# Patient Record
Sex: Female | Born: 1940 | Race: Black or African American | Hispanic: No | Smoking: Never smoker
Health system: Southern US, Community
[De-identification: ages and names within clinical notes are randomized; demographics above are authoritative.]

## PROBLEM LIST (undated history)

## (undated) DIAGNOSIS — D649 Anemia, unspecified: Secondary | ICD-10-CM

## (undated) DIAGNOSIS — M549 Dorsalgia, unspecified: Secondary | ICD-10-CM

## (undated) DIAGNOSIS — C189 Malignant neoplasm of colon, unspecified: Secondary | ICD-10-CM

## (undated) DIAGNOSIS — I10 Essential (primary) hypertension: Secondary | ICD-10-CM

## (undated) HISTORY — DX: Dorsalgia, unspecified: M54.9

## (undated) HISTORY — PX: ABDOMINAL HYSTERECTOMY: SHX81

## (undated) HISTORY — PX: CHOLECYSTECTOMY: SHX55

## (undated) HISTORY — DX: Anemia, unspecified: D64.9

## (undated) HISTORY — DX: Essential (primary) hypertension: I10

## (undated) HISTORY — DX: Malignant neoplasm of colon, unspecified: C18.9

---

## 2005-08-25 ENCOUNTER — Ambulatory Visit: Payer: Self-pay | Admitting: Internal Medicine

## 2006-08-31 ENCOUNTER — Ambulatory Visit: Payer: Self-pay | Admitting: Internal Medicine

## 2008-01-04 ENCOUNTER — Ambulatory Visit: Payer: Self-pay | Admitting: Internal Medicine

## 2009-01-07 ENCOUNTER — Ambulatory Visit: Payer: Self-pay | Admitting: Internal Medicine

## 2009-01-21 ENCOUNTER — Ambulatory Visit: Payer: Self-pay | Admitting: Internal Medicine

## 2009-03-19 ENCOUNTER — Ambulatory Visit: Payer: Self-pay | Admitting: Surgery

## 2009-06-30 ENCOUNTER — Ambulatory Visit: Payer: Self-pay | Admitting: Surgery

## 2011-03-08 ENCOUNTER — Ambulatory Visit: Payer: Self-pay | Admitting: Internal Medicine

## 2011-04-13 ENCOUNTER — Ambulatory Visit: Payer: Self-pay | Admitting: Surgery

## 2012-05-29 ENCOUNTER — Ambulatory Visit: Payer: Self-pay | Admitting: Internal Medicine

## 2012-05-30 ENCOUNTER — Ambulatory Visit: Payer: Self-pay | Admitting: Internal Medicine

## 2013-06-28 ENCOUNTER — Ambulatory Visit: Payer: Self-pay | Admitting: Internal Medicine

## 2013-07-02 ENCOUNTER — Ambulatory Visit: Payer: Self-pay | Admitting: Internal Medicine

## 2013-11-01 ENCOUNTER — Ambulatory Visit: Payer: Self-pay | Admitting: Gastroenterology

## 2013-11-02 ENCOUNTER — Emergency Department: Payer: Self-pay | Admitting: Emergency Medicine

## 2013-11-02 LAB — PATHOLOGY REPORT

## 2013-11-12 ENCOUNTER — Ambulatory Visit: Payer: Self-pay | Admitting: Surgery

## 2013-11-12 ENCOUNTER — Ambulatory Visit: Payer: Self-pay | Admitting: Anesthesiology

## 2013-11-12 LAB — POTASSIUM: Potassium: 3 mmol/L — ABNORMAL LOW (ref 3.5–5.1)

## 2013-11-16 ENCOUNTER — Inpatient Hospital Stay: Payer: Self-pay | Admitting: Surgery

## 2013-11-17 LAB — CBC WITH DIFFERENTIAL/PLATELET
Basophil #: 0 10*3/uL (ref 0.0–0.1)
Basophil %: 0.2 %
Eosinophil #: 0 10*3/uL (ref 0.0–0.7)
Eosinophil %: 0 %
HCT: 31.8 % — ABNORMAL LOW (ref 35.0–47.0)
HGB: 9.8 g/dL — ABNORMAL LOW (ref 12.0–16.0)
Lymphocyte #: 0.8 10*3/uL — ABNORMAL LOW (ref 1.0–3.6)
Lymphocyte %: 6.6 %
MCH: 23.8 pg — ABNORMAL LOW (ref 26.0–34.0)
MCHC: 30.7 g/dL — ABNORMAL LOW (ref 32.0–36.0)
MCV: 77 fL — ABNORMAL LOW (ref 80–100)
Monocyte #: 0.7 x10 3/mm (ref 0.2–0.9)
Monocyte %: 5.7 %
Neutrophil %: 87.5 %
Platelet: 230 10*3/uL (ref 150–440)
RBC: 4.1 10*6/uL (ref 3.80–5.20)

## 2013-11-17 LAB — BASIC METABOLIC PANEL
BUN: 8 mg/dL (ref 7–18)
Chloride: 104 mmol/L (ref 98–107)
Co2: 29 mmol/L (ref 21–32)
Creatinine: 1.04 mg/dL (ref 0.60–1.30)
EGFR (Non-African Amer.): 54 — ABNORMAL LOW
Glucose: 138 mg/dL — ABNORMAL HIGH (ref 65–99)
Potassium: 4.3 mmol/L (ref 3.5–5.1)
Sodium: 137 mmol/L (ref 136–145)

## 2013-11-21 LAB — PATHOLOGY REPORT

## 2013-12-04 ENCOUNTER — Ambulatory Visit: Payer: Self-pay | Admitting: Oncology

## 2013-12-25 LAB — COMPREHENSIVE METABOLIC PANEL
ALT: 48 U/L (ref 12–78)
AST: 38 U/L — AB (ref 15–37)
Albumin: 3.7 g/dL (ref 3.4–5.0)
Alkaline Phosphatase: 140 U/L — ABNORMAL HIGH
Anion Gap: 8 (ref 7–16)
BILIRUBIN TOTAL: 0.3 mg/dL (ref 0.2–1.0)
BUN: 14 mg/dL (ref 7–18)
CALCIUM: 9.3 mg/dL (ref 8.5–10.1)
CREATININE: 1.21 mg/dL (ref 0.60–1.30)
Chloride: 101 mmol/L (ref 98–107)
Co2: 30 mmol/L (ref 21–32)
EGFR (African American): 52 — ABNORMAL LOW
EGFR (Non-African Amer.): 45 — ABNORMAL LOW
GLUCOSE: 116 mg/dL — AB (ref 65–99)
OSMOLALITY: 279 (ref 275–301)
POTASSIUM: 3.1 mmol/L — AB (ref 3.5–5.1)
Sodium: 139 mmol/L (ref 136–145)
Total Protein: 8.6 g/dL — ABNORMAL HIGH (ref 6.4–8.2)

## 2013-12-25 LAB — CBC CANCER CENTER
Basophil #: 0 x10 3/mm (ref 0.0–0.1)
Basophil %: 0.5 %
EOS ABS: 0.2 x10 3/mm (ref 0.0–0.7)
Eosinophil %: 2 %
HCT: 38 % (ref 35.0–47.0)
HGB: 11.8 g/dL — AB (ref 12.0–16.0)
Lymphocyte #: 1.3 x10 3/mm (ref 1.0–3.6)
Lymphocyte %: 12.6 %
MCH: 24.6 pg — AB (ref 26.0–34.0)
MCHC: 31 g/dL — AB (ref 32.0–36.0)
MCV: 79 fL — ABNORMAL LOW (ref 80–100)
Monocyte #: 0.5 x10 3/mm (ref 0.2–0.9)
Monocyte %: 4.7 %
NEUTROS ABS: 8.2 x10 3/mm — AB (ref 1.4–6.5)
NEUTROS PCT: 80.2 %
Platelet: 220 x10 3/mm (ref 150–440)
RBC: 4.79 10*6/uL (ref 3.80–5.20)
RDW: 15.3 % — ABNORMAL HIGH (ref 11.5–14.5)
WBC: 10.2 x10 3/mm (ref 3.6–11.0)

## 2013-12-26 LAB — CEA: CEA: 392.1 ng/mL — AB (ref 0.0–4.7)

## 2013-12-30 ENCOUNTER — Ambulatory Visit: Payer: Self-pay | Admitting: Oncology

## 2014-01-01 ENCOUNTER — Ambulatory Visit: Payer: Self-pay | Admitting: Surgery

## 2014-01-03 ENCOUNTER — Ambulatory Visit: Payer: Self-pay | Admitting: Oncology

## 2014-01-03 LAB — CBC CANCER CENTER
BASOS ABS: 0.1 x10 3/mm (ref 0.0–0.1)
BASOS PCT: 0.6 %
Eosinophil #: 0.3 x10 3/mm (ref 0.0–0.7)
Eosinophil %: 2.6 %
HCT: 35.8 % (ref 35.0–47.0)
HGB: 11 g/dL — AB (ref 12.0–16.0)
Lymphocyte #: 1.6 x10 3/mm (ref 1.0–3.6)
Lymphocyte %: 13.8 %
MCH: 24 pg — ABNORMAL LOW (ref 26.0–34.0)
MCHC: 30.7 g/dL — AB (ref 32.0–36.0)
MCV: 78 fL — ABNORMAL LOW (ref 80–100)
MONO ABS: 0.7 x10 3/mm (ref 0.2–0.9)
MONOS PCT: 5.8 %
NEUTROS PCT: 77.2 %
Neutrophil #: 8.9 x10 3/mm — ABNORMAL HIGH (ref 1.4–6.5)
PLATELETS: 221 x10 3/mm (ref 150–440)
RBC: 4.58 10*6/uL (ref 3.80–5.20)
RDW: 14.9 % — ABNORMAL HIGH (ref 11.5–14.5)
WBC: 11.5 x10 3/mm — AB (ref 3.6–11.0)

## 2014-01-03 LAB — COMPREHENSIVE METABOLIC PANEL
ALBUMIN: 3.4 g/dL (ref 3.4–5.0)
ALK PHOS: 122 U/L — AB
Anion Gap: 10 (ref 7–16)
BUN: 13 mg/dL (ref 7–18)
Bilirubin,Total: 0.5 mg/dL (ref 0.2–1.0)
CHLORIDE: 102 mmol/L (ref 98–107)
Calcium, Total: 8.6 mg/dL (ref 8.5–10.1)
Co2: 29 mmol/L (ref 21–32)
Creatinine: 1.23 mg/dL (ref 0.60–1.30)
EGFR (African American): 51 — ABNORMAL LOW
GFR CALC NON AF AMER: 44 — AB
GLUCOSE: 126 mg/dL — AB (ref 65–99)
Osmolality: 283 (ref 275–301)
POTASSIUM: 3.7 mmol/L (ref 3.5–5.1)
SGOT(AST): 48 U/L — ABNORMAL HIGH (ref 15–37)
SGPT (ALT): 43 U/L (ref 12–78)
Sodium: 141 mmol/L (ref 136–145)
Total Protein: 7.8 g/dL (ref 6.4–8.2)

## 2014-01-10 LAB — COMPREHENSIVE METABOLIC PANEL
ALBUMIN: 3.4 g/dL (ref 3.4–5.0)
ALK PHOS: 113 U/L
ANION GAP: 11 (ref 7–16)
BILIRUBIN TOTAL: 0.3 mg/dL (ref 0.2–1.0)
BUN: 13 mg/dL (ref 7–18)
CALCIUM: 8.9 mg/dL (ref 8.5–10.1)
Chloride: 98 mmol/L (ref 98–107)
Co2: 29 mmol/L (ref 21–32)
Creatinine: 1.15 mg/dL (ref 0.60–1.30)
EGFR (African American): 55 — ABNORMAL LOW
EGFR (Non-African Amer.): 48 — ABNORMAL LOW
Glucose: 154 mg/dL — ABNORMAL HIGH (ref 65–99)
Osmolality: 279 (ref 275–301)
Potassium: 3.4 mmol/L — ABNORMAL LOW (ref 3.5–5.1)
SGOT(AST): 24 U/L (ref 15–37)
SGPT (ALT): 30 U/L (ref 12–78)
Sodium: 138 mmol/L (ref 136–145)
Total Protein: 7.7 g/dL (ref 6.4–8.2)

## 2014-01-10 LAB — CBC CANCER CENTER
BASOS ABS: 0 x10 3/mm (ref 0.0–0.1)
BASOS PCT: 0.3 %
Eosinophil #: 0.3 x10 3/mm (ref 0.0–0.7)
Eosinophil %: 4.5 %
HCT: 36.2 % (ref 35.0–47.0)
HGB: 11.1 g/dL — ABNORMAL LOW (ref 12.0–16.0)
LYMPHS PCT: 16.1 %
Lymphocyte #: 1.1 x10 3/mm (ref 1.0–3.6)
MCH: 24.2 pg — ABNORMAL LOW (ref 26.0–34.0)
MCHC: 30.6 g/dL — ABNORMAL LOW (ref 32.0–36.0)
MCV: 79 fL — ABNORMAL LOW (ref 80–100)
MONOS PCT: 2.9 %
Monocyte #: 0.2 x10 3/mm (ref 0.2–0.9)
NEUTROS ABS: 5.3 x10 3/mm (ref 1.4–6.5)
NEUTROS PCT: 76.2 %
PLATELETS: 211 x10 3/mm (ref 150–440)
RBC: 4.59 10*6/uL (ref 3.80–5.20)
RDW: 14.8 % — ABNORMAL HIGH (ref 11.5–14.5)
WBC: 7 x10 3/mm (ref 3.6–11.0)

## 2014-01-17 LAB — COMPREHENSIVE METABOLIC PANEL
ALK PHOS: 124 U/L — AB
Albumin: 3.3 g/dL — ABNORMAL LOW (ref 3.4–5.0)
Anion Gap: 13 (ref 7–16)
BILIRUBIN TOTAL: 0.3 mg/dL (ref 0.2–1.0)
BUN: 18 mg/dL (ref 7–18)
CHLORIDE: 103 mmol/L (ref 98–107)
Calcium, Total: 10.2 mg/dL — ABNORMAL HIGH (ref 8.5–10.1)
Co2: 26 mmol/L (ref 21–32)
Creatinine: 1.18 mg/dL (ref 0.60–1.30)
GFR CALC AF AMER: 54 — AB
GFR CALC NON AF AMER: 46 — AB
GLUCOSE: 100 mg/dL — AB (ref 65–99)
Osmolality: 285 (ref 275–301)
POTASSIUM: 3.2 mmol/L — AB (ref 3.5–5.1)
SGOT(AST): 27 U/L (ref 15–37)
SGPT (ALT): 35 U/L (ref 12–78)
Sodium: 142 mmol/L (ref 136–145)
Total Protein: 7.2 g/dL (ref 6.4–8.2)

## 2014-01-17 LAB — CBC CANCER CENTER
BASOS ABS: 0 x10 3/mm (ref 0.0–0.1)
Basophil %: 0.7 %
EOS PCT: 1.5 %
Eosinophil #: 0.1 x10 3/mm (ref 0.0–0.7)
HCT: 34 % — AB (ref 35.0–47.0)
HGB: 10.5 g/dL — ABNORMAL LOW (ref 12.0–16.0)
LYMPHS PCT: 28.4 %
Lymphocyte #: 1 x10 3/mm (ref 1.0–3.6)
MCH: 24.1 pg — AB (ref 26.0–34.0)
MCHC: 30.8 g/dL — AB (ref 32.0–36.0)
MCV: 78 fL — AB (ref 80–100)
MONOS PCT: 8.6 %
Monocyte #: 0.3 x10 3/mm (ref 0.2–0.9)
Neutrophil #: 2.2 x10 3/mm (ref 1.4–6.5)
Neutrophil %: 60.8 %
Platelet: 201 x10 3/mm (ref 150–440)
RBC: 4.35 10*6/uL (ref 3.80–5.20)
RDW: 14.8 % — AB (ref 11.5–14.5)
WBC: 3.5 x10 3/mm — ABNORMAL LOW (ref 3.6–11.0)

## 2014-01-27 ENCOUNTER — Ambulatory Visit: Payer: Self-pay | Admitting: Oncology

## 2014-01-31 LAB — CBC CANCER CENTER
BASOS ABS: 0 x10 3/mm (ref 0.0–0.1)
BASOS PCT: 1 %
EOS PCT: 3.7 %
Eosinophil #: 0.1 x10 3/mm (ref 0.0–0.7)
HCT: 33.9 % — ABNORMAL LOW (ref 35.0–47.0)
HGB: 10.3 g/dL — AB (ref 12.0–16.0)
Lymphocyte #: 1.3 x10 3/mm (ref 1.0–3.6)
Lymphocyte %: 33.7 %
MCH: 23.8 pg — AB (ref 26.0–34.0)
MCHC: 30.4 g/dL — ABNORMAL LOW (ref 32.0–36.0)
MCV: 78 fL — AB (ref 80–100)
MONO ABS: 0.4 x10 3/mm (ref 0.2–0.9)
MONOS PCT: 9.9 %
NEUTROS PCT: 51.7 %
Neutrophil #: 1.9 x10 3/mm (ref 1.4–6.5)
Platelet: 169 x10 3/mm (ref 150–440)
RBC: 4.33 10*6/uL (ref 3.80–5.20)
RDW: 14.7 % — ABNORMAL HIGH (ref 11.5–14.5)
WBC: 3.7 x10 3/mm (ref 3.6–11.0)

## 2014-01-31 LAB — COMPREHENSIVE METABOLIC PANEL
ALBUMIN: 3.1 g/dL — AB (ref 3.4–5.0)
ALT: 24 U/L (ref 12–78)
ANION GAP: 11 (ref 7–16)
AST: 21 U/L (ref 15–37)
Alkaline Phosphatase: 140 U/L — ABNORMAL HIGH
BUN: 10 mg/dL (ref 7–18)
Bilirubin,Total: 0.3 mg/dL (ref 0.2–1.0)
Calcium, Total: 8.5 mg/dL (ref 8.5–10.1)
Chloride: 104 mmol/L (ref 98–107)
Co2: 24 mmol/L (ref 21–32)
Creatinine: 1.27 mg/dL (ref 0.60–1.30)
EGFR (African American): 49 — ABNORMAL LOW
EGFR (Non-African Amer.): 42 — ABNORMAL LOW
GLUCOSE: 155 mg/dL — AB (ref 65–99)
OSMOLALITY: 280 (ref 275–301)
POTASSIUM: 3.3 mmol/L — AB (ref 3.5–5.1)
Sodium: 139 mmol/L (ref 136–145)
Total Protein: 6.9 g/dL (ref 6.4–8.2)

## 2014-02-14 LAB — COMPREHENSIVE METABOLIC PANEL
ALBUMIN: 3.4 g/dL (ref 3.4–5.0)
ALT: 24 U/L (ref 12–78)
ANION GAP: 13 (ref 7–16)
Alkaline Phosphatase: 159 U/L — ABNORMAL HIGH
BILIRUBIN TOTAL: 0.4 mg/dL (ref 0.2–1.0)
BUN: 15 mg/dL (ref 7–18)
CALCIUM: 9.7 mg/dL (ref 8.5–10.1)
CHLORIDE: 104 mmol/L (ref 98–107)
CREATININE: 1.18 mg/dL (ref 0.60–1.30)
Co2: 24 mmol/L (ref 21–32)
EGFR (Non-African Amer.): 46 — ABNORMAL LOW
GFR CALC AF AMER: 54 — AB
Glucose: 107 mg/dL — ABNORMAL HIGH (ref 65–99)
Osmolality: 283 (ref 275–301)
Potassium: 3 mmol/L — ABNORMAL LOW (ref 3.5–5.1)
SGOT(AST): 27 U/L (ref 15–37)
SODIUM: 141 mmol/L (ref 136–145)
TOTAL PROTEIN: 7.5 g/dL (ref 6.4–8.2)

## 2014-02-14 LAB — CBC CANCER CENTER
Basophil #: 0 x10 3/mm (ref 0.0–0.1)
Basophil %: 1.1 %
Eosinophil #: 0.1 x10 3/mm (ref 0.0–0.7)
Eosinophil %: 3.6 %
HCT: 35.7 % (ref 35.0–47.0)
HGB: 11 g/dL — ABNORMAL LOW (ref 12.0–16.0)
LYMPHS ABS: 1.4 x10 3/mm (ref 1.0–3.6)
Lymphocyte %: 37.1 %
MCH: 23.9 pg — ABNORMAL LOW (ref 26.0–34.0)
MCHC: 30.9 g/dL — ABNORMAL LOW (ref 32.0–36.0)
MCV: 77 fL — AB (ref 80–100)
Monocyte #: 0.3 x10 3/mm (ref 0.2–0.9)
Monocyte %: 9 %
NEUTROS ABS: 1.9 x10 3/mm (ref 1.4–6.5)
Neutrophil %: 49.2 %
Platelet: 154 x10 3/mm (ref 150–440)
RBC: 4.61 10*6/uL (ref 3.80–5.20)
RDW: 15.8 % — ABNORMAL HIGH (ref 11.5–14.5)
WBC: 3.8 x10 3/mm (ref 3.6–11.0)

## 2014-02-16 LAB — CEA: CEA: 166.6 ng/mL — ABNORMAL HIGH (ref 0.0–4.7)

## 2014-02-27 ENCOUNTER — Ambulatory Visit: Payer: Self-pay | Admitting: Oncology

## 2014-02-28 LAB — CBC CANCER CENTER
Basophil #: 0 x10 3/mm (ref 0.0–0.1)
Basophil %: 1 %
Eosinophil #: 0.1 x10 3/mm (ref 0.0–0.7)
Eosinophil %: 2.5 %
HCT: 33 % — ABNORMAL LOW (ref 35.0–47.0)
HGB: 10.3 g/dL — ABNORMAL LOW (ref 12.0–16.0)
LYMPHS ABS: 1 x10 3/mm (ref 1.0–3.6)
Lymphocyte %: 41.3 %
MCH: 24.4 pg — ABNORMAL LOW (ref 26.0–34.0)
MCHC: 31.1 g/dL — AB (ref 32.0–36.0)
MCV: 78 fL — ABNORMAL LOW (ref 80–100)
MONO ABS: 0.3 x10 3/mm (ref 0.2–0.9)
Monocyte %: 11.7 %
NEUTROS ABS: 1 x10 3/mm — AB (ref 1.4–6.5)
Neutrophil %: 43.5 %
Platelet: 101 x10 3/mm — ABNORMAL LOW (ref 150–440)
RBC: 4.22 10*6/uL (ref 3.80–5.20)
RDW: 16.7 % — ABNORMAL HIGH (ref 11.5–14.5)
WBC: 2.4 x10 3/mm — ABNORMAL LOW (ref 3.6–11.0)

## 2014-02-28 LAB — COMPREHENSIVE METABOLIC PANEL
ALT: 32 U/L (ref 12–78)
Albumin: 3.2 g/dL — ABNORMAL LOW (ref 3.4–5.0)
Alkaline Phosphatase: 139 U/L — ABNORMAL HIGH
Anion Gap: 12 (ref 7–16)
BILIRUBIN TOTAL: 0.5 mg/dL (ref 0.2–1.0)
BUN: 12 mg/dL (ref 7–18)
CO2: 26 mmol/L (ref 21–32)
CREATININE: 1.09 mg/dL (ref 0.60–1.30)
Calcium, Total: 9.3 mg/dL (ref 8.5–10.1)
Chloride: 106 mmol/L (ref 98–107)
EGFR (African American): 59 — ABNORMAL LOW
GFR CALC NON AF AMER: 51 — AB
GLUCOSE: 100 mg/dL — AB (ref 65–99)
OSMOLALITY: 287 (ref 275–301)
Potassium: 3.2 mmol/L — ABNORMAL LOW (ref 3.5–5.1)
SGOT(AST): 34 U/L (ref 15–37)
SODIUM: 144 mmol/L (ref 136–145)
TOTAL PROTEIN: 6.8 g/dL (ref 6.4–8.2)

## 2014-03-07 LAB — CBC CANCER CENTER
BASOS ABS: 0 x10 3/mm (ref 0.0–0.1)
BASOS PCT: 0.6 %
EOS PCT: 1.7 %
Eosinophil #: 0.1 x10 3/mm (ref 0.0–0.7)
HCT: 35.3 % (ref 35.0–47.0)
HGB: 10.7 g/dL — ABNORMAL LOW (ref 12.0–16.0)
LYMPHS PCT: 31.7 %
Lymphocyte #: 1.4 x10 3/mm (ref 1.0–3.6)
MCH: 24.1 pg — ABNORMAL LOW (ref 26.0–34.0)
MCHC: 30.4 g/dL — AB (ref 32.0–36.0)
MCV: 79 fL — ABNORMAL LOW (ref 80–100)
Monocyte #: 0.5 x10 3/mm (ref 0.2–0.9)
Monocyte %: 11.5 %
Neutrophil #: 2.4 x10 3/mm (ref 1.4–6.5)
Neutrophil %: 54.5 %
Platelet: 142 x10 3/mm — ABNORMAL LOW (ref 150–440)
RBC: 4.44 10*6/uL (ref 3.80–5.20)
RDW: 17.6 % — ABNORMAL HIGH (ref 11.5–14.5)
WBC: 4.5 x10 3/mm (ref 3.6–11.0)

## 2014-03-07 LAB — COMPREHENSIVE METABOLIC PANEL
ALBUMIN: 3.3 g/dL — AB (ref 3.4–5.0)
ALK PHOS: 163 U/L — AB
ANION GAP: 10 (ref 7–16)
BUN: 8 mg/dL (ref 7–18)
Bilirubin,Total: 0.3 mg/dL (ref 0.2–1.0)
CALCIUM: 9.3 mg/dL (ref 8.5–10.1)
CREATININE: 1.14 mg/dL (ref 0.60–1.30)
Chloride: 105 mmol/L (ref 98–107)
Co2: 26 mmol/L (ref 21–32)
EGFR (African American): 56 — ABNORMAL LOW
EGFR (Non-African Amer.): 48 — ABNORMAL LOW
GLUCOSE: 94 mg/dL (ref 65–99)
Osmolality: 279 (ref 275–301)
Potassium: 3.5 mmol/L (ref 3.5–5.1)
SGOT(AST): 23 U/L (ref 15–37)
SGPT (ALT): 22 U/L (ref 12–78)
Sodium: 141 mmol/L (ref 136–145)
Total Protein: 7.1 g/dL (ref 6.4–8.2)

## 2014-03-07 LAB — MAGNESIUM: MAGNESIUM: 1.9 mg/dL

## 2014-03-21 LAB — CBC CANCER CENTER
Basophil #: 0 x10 3/mm (ref 0.0–0.1)
Basophil %: 0.6 %
EOS ABS: 0.1 x10 3/mm (ref 0.0–0.7)
EOS PCT: 1.5 %
HCT: 34.9 % — ABNORMAL LOW (ref 35.0–47.0)
HGB: 10.7 g/dL — AB (ref 12.0–16.0)
LYMPHS PCT: 20.2 %
Lymphocyte #: 1.1 x10 3/mm (ref 1.0–3.6)
MCH: 24.2 pg — AB (ref 26.0–34.0)
MCHC: 30.7 g/dL — ABNORMAL LOW (ref 32.0–36.0)
MCV: 79 fL — ABNORMAL LOW (ref 80–100)
MONOS PCT: 6.9 %
Monocyte #: 0.4 x10 3/mm (ref 0.2–0.9)
NEUTROS ABS: 3.9 x10 3/mm (ref 1.4–6.5)
Neutrophil %: 70.8 %
Platelet: 136 x10 3/mm — ABNORMAL LOW (ref 150–440)
RBC: 4.43 10*6/uL (ref 3.80–5.20)
RDW: 17.1 % — AB (ref 11.5–14.5)
WBC: 5.5 x10 3/mm (ref 3.6–11.0)

## 2014-03-21 LAB — COMPREHENSIVE METABOLIC PANEL
ALK PHOS: 165 U/L — AB
ALT: 22 U/L (ref 12–78)
ANION GAP: 10 (ref 7–16)
AST: 24 U/L (ref 15–37)
Albumin: 3.3 g/dL — ABNORMAL LOW (ref 3.4–5.0)
BUN: 9 mg/dL (ref 7–18)
Bilirubin,Total: 0.3 mg/dL (ref 0.2–1.0)
CO2: 27 mmol/L (ref 21–32)
CREATININE: 1.13 mg/dL (ref 0.60–1.30)
Calcium, Total: 9.7 mg/dL (ref 8.5–10.1)
Chloride: 106 mmol/L (ref 98–107)
EGFR (African American): 57 — ABNORMAL LOW
GFR CALC NON AF AMER: 49 — AB
GLUCOSE: 99 mg/dL (ref 65–99)
Osmolality: 284 (ref 275–301)
POTASSIUM: 3.7 mmol/L (ref 3.5–5.1)
Sodium: 143 mmol/L (ref 136–145)
TOTAL PROTEIN: 7.1 g/dL (ref 6.4–8.2)

## 2014-03-29 ENCOUNTER — Ambulatory Visit: Payer: Self-pay | Admitting: Oncology

## 2014-04-04 LAB — COMPREHENSIVE METABOLIC PANEL
ALBUMIN: 3.2 g/dL — AB (ref 3.4–5.0)
ANION GAP: 8 (ref 7–16)
Alkaline Phosphatase: 161 U/L — ABNORMAL HIGH
BUN: 10 mg/dL (ref 7–18)
Bilirubin,Total: 0.2 mg/dL (ref 0.2–1.0)
Calcium, Total: 9 mg/dL (ref 8.5–10.1)
Chloride: 106 mmol/L (ref 98–107)
Co2: 28 mmol/L (ref 21–32)
Creatinine: 1.04 mg/dL (ref 0.60–1.30)
EGFR (African American): >60
GFR CALC NON AF AMER: 54 — AB
Glucose: 124 mg/dL — ABNORMAL HIGH (ref 65–99)
OSMOLALITY: 284 (ref 275–301)
POTASSIUM: 3.5 mmol/L (ref 3.5–5.1)
SGOT(AST): 27 U/L (ref 15–37)
SGPT (ALT): 26 U/L (ref 12–78)
Sodium: 142 mmol/L (ref 136–145)
Total Protein: 6.9 g/dL (ref 6.4–8.2)

## 2014-04-04 LAB — CBC CANCER CENTER
Basophil #: 0 x10 3/mm (ref 0.0–0.1)
Basophil %: 0.6 %
Eosinophil #: 0 x10 3/mm (ref 0.0–0.7)
Eosinophil %: 0.9 %
HCT: 33.7 % — ABNORMAL LOW (ref 35.0–47.0)
HGB: 10.6 g/dL — ABNORMAL LOW (ref 12.0–16.0)
LYMPHS PCT: 21.7 %
Lymphocyte #: 1.1 x10 3/mm (ref 1.0–3.6)
MCH: 24.8 pg — ABNORMAL LOW (ref 26.0–34.0)
MCHC: 31.6 g/dL — ABNORMAL LOW (ref 32.0–36.0)
MCV: 78 fL — ABNORMAL LOW (ref 80–100)
Monocyte #: 0.5 x10 3/mm (ref 0.2–0.9)
Monocyte %: 9 %
Neutrophil #: 3.4 x10 3/mm (ref 1.4–6.5)
Neutrophil %: 67.8 %
PLATELETS: 133 x10 3/mm — AB (ref 150–440)
RBC: 4.3 10*6/uL (ref 3.80–5.20)
RDW: 17.3 % — ABNORMAL HIGH (ref 11.5–14.5)
WBC: 5.1 x10 3/mm (ref 3.6–11.0)

## 2014-04-18 LAB — COMPREHENSIVE METABOLIC PANEL
ALBUMIN: 3.4 g/dL (ref 3.4–5.0)
ALT: 26 U/L (ref 12–78)
AST: 26 U/L (ref 15–37)
Alkaline Phosphatase: 166 U/L — ABNORMAL HIGH
Anion Gap: 10 (ref 7–16)
BUN: 7 mg/dL (ref 7–18)
Bilirubin,Total: 0.3 mg/dL (ref 0.2–1.0)
CREATININE: 1.02 mg/dL (ref 0.60–1.30)
Calcium, Total: 8.9 mg/dL (ref 8.5–10.1)
Chloride: 107 mmol/L (ref 98–107)
Co2: 27 mmol/L (ref 21–32)
EGFR (Non-African Amer.): 55 — ABNORMAL LOW
Glucose: 99 mg/dL (ref 65–99)
Osmolality: 285 (ref 275–301)
POTASSIUM: 3.5 mmol/L (ref 3.5–5.1)
SODIUM: 144 mmol/L (ref 136–145)
Total Protein: 7 g/dL (ref 6.4–8.2)

## 2014-04-18 LAB — CBC CANCER CENTER
Basophil #: 0.1 x10 3/mm (ref 0.0–0.1)
Basophil %: 1.5 %
EOS ABS: 0.1 x10 3/mm (ref 0.0–0.7)
Eosinophil %: 1.4 %
HCT: 33.5 % — AB (ref 35.0–47.0)
HGB: 10.5 g/dL — AB (ref 12.0–16.0)
Lymphocyte #: 1.2 x10 3/mm (ref 1.0–3.6)
Lymphocyte %: 26.7 %
MCH: 24.3 pg — AB (ref 26.0–34.0)
MCHC: 31.4 g/dL — AB (ref 32.0–36.0)
MCV: 77 fL — ABNORMAL LOW (ref 80–100)
Monocyte #: 0.4 x10 3/mm (ref 0.2–0.9)
Monocyte %: 8.6 %
Neutrophil #: 2.7 x10 3/mm (ref 1.4–6.5)
Neutrophil %: 61.8 %
Platelet: 115 x10 3/mm — ABNORMAL LOW (ref 150–440)
RBC: 4.34 10*6/uL (ref 3.80–5.20)
RDW: 17.3 % — ABNORMAL HIGH (ref 11.5–14.5)
WBC: 4.4 x10 3/mm (ref 3.6–11.0)

## 2014-04-29 ENCOUNTER — Ambulatory Visit: Payer: Self-pay | Admitting: Oncology

## 2014-05-02 LAB — COMPREHENSIVE METABOLIC PANEL
ALK PHOS: 167 U/L — AB
ANION GAP: 11 (ref 7–16)
Albumin: 3.2 g/dL — ABNORMAL LOW (ref 3.4–5.0)
BILIRUBIN TOTAL: 0.2 mg/dL (ref 0.2–1.0)
BUN: 11 mg/dL (ref 7–18)
Calcium, Total: 9.3 mg/dL (ref 8.5–10.1)
Chloride: 107 mmol/L (ref 98–107)
Co2: 25 mmol/L (ref 21–32)
Creatinine: 0.99 mg/dL (ref 0.60–1.30)
GFR CALC NON AF AMER: 57 — AB
Glucose: 119 mg/dL — ABNORMAL HIGH (ref 65–99)
Osmolality: 286 (ref 275–301)
Potassium: 3.4 mmol/L — ABNORMAL LOW (ref 3.5–5.1)
SGOT(AST): 34 U/L (ref 15–37)
SGPT (ALT): 31 U/L (ref 12–78)
SODIUM: 143 mmol/L (ref 136–145)
Total Protein: 6.9 g/dL (ref 6.4–8.2)

## 2014-05-02 LAB — CBC CANCER CENTER
Basophil #: 0 x10 3/mm (ref 0.0–0.1)
Basophil %: 0.4 %
Eosinophil #: 0.1 x10 3/mm (ref 0.0–0.7)
Eosinophil %: 1.6 %
HCT: 34.1 % — AB (ref 35.0–47.0)
HGB: 10.4 g/dL — AB (ref 12.0–16.0)
LYMPHS PCT: 24.3 %
Lymphocyte #: 1 x10 3/mm (ref 1.0–3.6)
MCH: 24.2 pg — AB (ref 26.0–34.0)
MCHC: 30.5 g/dL — AB (ref 32.0–36.0)
MCV: 79 fL — ABNORMAL LOW (ref 80–100)
MONOS PCT: 6.9 %
Monocyte #: 0.3 x10 3/mm (ref 0.2–0.9)
Neutrophil #: 2.6 x10 3/mm (ref 1.4–6.5)
Neutrophil %: 66.8 %
Platelet: 92 x10 3/mm — ABNORMAL LOW (ref 150–440)
RBC: 4.31 10*6/uL (ref 3.80–5.20)
RDW: 17.8 % — AB (ref 11.5–14.5)
WBC: 3.9 x10 3/mm (ref 3.6–11.0)

## 2014-05-03 LAB — CEA: CEA: 13 ng/mL — ABNORMAL HIGH (ref 0.0–4.7)

## 2014-05-09 LAB — CBC CANCER CENTER
BASOS ABS: 0 x10 3/mm (ref 0.0–0.1)
BASOS PCT: 0.7 %
Eosinophil #: 0.1 x10 3/mm (ref 0.0–0.7)
Eosinophil %: 2.4 %
HCT: 34.7 % — ABNORMAL LOW (ref 35.0–47.0)
HGB: 10.7 g/dL — AB (ref 12.0–16.0)
LYMPHS ABS: 1.1 x10 3/mm (ref 1.0–3.6)
Lymphocyte %: 37.2 %
MCH: 24.7 pg — ABNORMAL LOW (ref 26.0–34.0)
MCHC: 31 g/dL — AB (ref 32.0–36.0)
MCV: 80 fL (ref 80–100)
Monocyte #: 0.5 x10 3/mm (ref 0.2–0.9)
Monocyte %: 14.7 %
NEUTROS PCT: 45 %
Neutrophil #: 1.4 x10 3/mm (ref 1.4–6.5)
PLATELETS: 108 x10 3/mm — AB (ref 150–440)
RBC: 4.34 10*6/uL (ref 3.80–5.20)
RDW: 17.4 % — ABNORMAL HIGH (ref 11.5–14.5)
WBC: 3.1 x10 3/mm — ABNORMAL LOW (ref 3.6–11.0)

## 2014-05-09 LAB — COMPREHENSIVE METABOLIC PANEL
ALK PHOS: 181 U/L — AB
ALT: 24 U/L (ref 12–78)
Albumin: 3.2 g/dL — ABNORMAL LOW (ref 3.4–5.0)
Anion Gap: 12 (ref 7–16)
BUN: 11 mg/dL (ref 7–18)
Bilirubin,Total: 0.3 mg/dL (ref 0.2–1.0)
CO2: 25 mmol/L (ref 21–32)
Calcium, Total: 9.4 mg/dL (ref 8.5–10.1)
Chloride: 106 mmol/L (ref 98–107)
Creatinine: 1.1 mg/dL (ref 0.60–1.30)
GFR CALC AF AMER: 58 — AB
GFR CALC NON AF AMER: 50 — AB
GLUCOSE: 101 mg/dL — AB (ref 65–99)
OSMOLALITY: 285 (ref 275–301)
Potassium: 3.6 mmol/L (ref 3.5–5.1)
SGOT(AST): 25 U/L (ref 15–37)
SODIUM: 143 mmol/L (ref 136–145)
Total Protein: 7.2 g/dL (ref 6.4–8.2)

## 2014-05-10 LAB — CEA: CEA: 10.7 ng/mL — ABNORMAL HIGH (ref 0.0–4.7)

## 2014-05-29 ENCOUNTER — Ambulatory Visit: Payer: Self-pay | Admitting: Oncology

## 2014-06-06 LAB — CBC CANCER CENTER
BASOS PCT: 0.6 %
Basophil #: 0 x10 3/mm (ref 0.0–0.1)
Eosinophil #: 0.1 x10 3/mm (ref 0.0–0.7)
Eosinophil %: 1.1 %
HCT: 35 % (ref 35.0–47.0)
HGB: 10.9 g/dL — ABNORMAL LOW (ref 12.0–16.0)
LYMPHS ABS: 1.4 x10 3/mm (ref 1.0–3.6)
Lymphocyte %: 19.5 %
MCH: 25.3 pg — AB (ref 26.0–34.0)
MCHC: 31.2 g/dL — ABNORMAL LOW (ref 32.0–36.0)
MCV: 81 fL (ref 80–100)
MONO ABS: 0.5 x10 3/mm (ref 0.2–0.9)
Monocyte %: 6.8 %
NEUTROS PCT: 72 %
Neutrophil #: 5.2 x10 3/mm (ref 1.4–6.5)
Platelet: 128 x10 3/mm — ABNORMAL LOW (ref 150–440)
RBC: 4.31 10*6/uL (ref 3.80–5.20)
RDW: 16.8 % — ABNORMAL HIGH (ref 11.5–14.5)
WBC: 7.3 x10 3/mm (ref 3.6–11.0)

## 2014-06-06 LAB — COMPREHENSIVE METABOLIC PANEL
ALT: 26 U/L (ref 12–78)
ANION GAP: 13 (ref 7–16)
AST: 33 U/L (ref 15–37)
Albumin: 3.3 g/dL — ABNORMAL LOW (ref 3.4–5.0)
Alkaline Phosphatase: 172 U/L — ABNORMAL HIGH
BUN: 10 mg/dL (ref 7–18)
Bilirubin,Total: 0.3 mg/dL (ref 0.2–1.0)
CHLORIDE: 104 mmol/L (ref 98–107)
Calcium, Total: 9.3 mg/dL (ref 8.5–10.1)
Co2: 27 mmol/L (ref 21–32)
Creatinine: 1.09 mg/dL (ref 0.60–1.30)
EGFR (African American): 59 — ABNORMAL LOW
GFR CALC NON AF AMER: 51 — AB
GLUCOSE: 97 mg/dL (ref 65–99)
OSMOLALITY: 286 (ref 275–301)
POTASSIUM: 3.7 mmol/L (ref 3.5–5.1)
Sodium: 144 mmol/L (ref 136–145)
TOTAL PROTEIN: 7.4 g/dL (ref 6.4–8.2)

## 2014-06-07 LAB — CEA: CEA: 6.9 ng/mL — ABNORMAL HIGH (ref 0.0–4.7)

## 2014-06-13 LAB — BASIC METABOLIC PANEL
Anion Gap: 6 — ABNORMAL LOW (ref 7–16)
BUN: 9 mg/dL (ref 7–18)
CREATININE: 0.89 mg/dL (ref 0.60–1.30)
Calcium, Total: 9 mg/dL (ref 8.5–10.1)
Chloride: 107 mmol/L (ref 98–107)
Co2: 27 mmol/L (ref 21–32)
EGFR (African American): >60
EGFR (Non-African Amer.): >60
Glucose: 82 mg/dL (ref 65–99)
Osmolality: 277 (ref 275–301)
Potassium: 3.7 mmol/L (ref 3.5–5.1)
Sodium: 140 mmol/L (ref 136–145)

## 2014-06-13 LAB — CBC CANCER CENTER
BASOS ABS: 0 x10 3/mm (ref 0.0–0.1)
Basophil %: 0.5 %
Eosinophil #: 0.2 x10 3/mm (ref 0.0–0.7)
Eosinophil %: 2.3 %
HCT: 34.3 % — ABNORMAL LOW (ref 35.0–47.0)
HGB: 10.5 g/dL — AB (ref 12.0–16.0)
Lymphocyte #: 1.6 x10 3/mm (ref 1.0–3.6)
Lymphocyte %: 23.9 %
MCH: 24.7 pg — ABNORMAL LOW (ref 26.0–34.0)
MCHC: 30.6 g/dL — AB (ref 32.0–36.0)
MCV: 81 fL (ref 80–100)
Monocyte #: 0.4 x10 3/mm (ref 0.2–0.9)
Monocyte %: 6 %
NEUTROS ABS: 4.6 x10 3/mm (ref 1.4–6.5)
Neutrophil %: 67.3 %
PLATELETS: 128 x10 3/mm — AB (ref 150–440)
RBC: 4.25 10*6/uL (ref 3.80–5.20)
RDW: 16.4 % — ABNORMAL HIGH (ref 11.5–14.5)
WBC: 6.8 x10 3/mm (ref 3.6–11.0)

## 2014-06-29 ENCOUNTER — Ambulatory Visit: Payer: Self-pay | Admitting: Oncology

## 2014-07-30 ENCOUNTER — Ambulatory Visit: Payer: Self-pay | Admitting: Oncology

## 2014-09-12 ENCOUNTER — Ambulatory Visit: Payer: Self-pay | Admitting: Family Medicine

## 2014-09-16 ENCOUNTER — Ambulatory Visit: Payer: Self-pay | Admitting: Oncology

## 2014-09-16 LAB — COMPREHENSIVE METABOLIC PANEL
ALBUMIN: 3.6 g/dL (ref 3.4–5.0)
ALK PHOS: 144 U/L — AB
AST: 25 U/L (ref 15–37)
Anion Gap: 8 (ref 7–16)
BUN: 17 mg/dL (ref 7–18)
Bilirubin,Total: 0.4 mg/dL (ref 0.2–1.0)
Calcium, Total: 9.9 mg/dL (ref 8.5–10.1)
Chloride: 102 mmol/L (ref 98–107)
Co2: 31 mmol/L (ref 21–32)
Creatinine: 1.04 mg/dL (ref 0.60–1.30)
EGFR (African American): >60
GFR CALC NON AF AMER: 55 — AB
Glucose: 107 mg/dL — ABNORMAL HIGH (ref 65–99)
Osmolality: 283 (ref 275–301)
POTASSIUM: 3.6 mmol/L (ref 3.5–5.1)
SGPT (ALT): 30 U/L
SODIUM: 141 mmol/L (ref 136–145)
Total Protein: 7.6 g/dL (ref 6.4–8.2)

## 2014-09-16 LAB — CBC CANCER CENTER
BASOS ABS: 0 x10 3/mm (ref 0.0–0.1)
Basophil %: 0.5 %
Eosinophil #: 0.2 x10 3/mm (ref 0.0–0.7)
Eosinophil %: 2.3 %
HCT: 38.2 % (ref 35.0–47.0)
HGB: 11.8 g/dL — ABNORMAL LOW (ref 12.0–16.0)
LYMPHS PCT: 20 %
Lymphocyte #: 1.4 x10 3/mm (ref 1.0–3.6)
MCH: 25.2 pg — ABNORMAL LOW (ref 26.0–34.0)
MCHC: 30.9 g/dL — ABNORMAL LOW (ref 32.0–36.0)
MCV: 82 fL (ref 80–100)
Monocyte #: 0.4 x10 3/mm (ref 0.2–0.9)
Monocyte %: 5.2 %
Neutrophil #: 4.9 x10 3/mm (ref 1.4–6.5)
Neutrophil %: 72 %
Platelet: 159 x10 3/mm (ref 150–440)
RBC: 4.68 10*6/uL (ref 3.80–5.20)
RDW: 14.1 % (ref 11.5–14.5)
WBC: 6.8 x10 3/mm (ref 3.6–11.0)

## 2014-09-17 LAB — CEA: CEA: 15.1 ng/mL — AB (ref 0.0–4.7)

## 2014-09-29 ENCOUNTER — Ambulatory Visit: Payer: Self-pay | Admitting: Oncology

## 2014-10-21 LAB — CEA: CEA: 49.5 ng/mL — ABNORMAL HIGH (ref 0.0–4.7)

## 2014-10-29 ENCOUNTER — Ambulatory Visit: Payer: Self-pay | Admitting: Oncology

## 2014-11-18 LAB — CBC CANCER CENTER
BASOS ABS: 0 x10 3/mm (ref 0.0–0.1)
Basophil %: 0.5 %
EOS PCT: 1.8 %
Eosinophil #: 0.1 x10 3/mm (ref 0.0–0.7)
HCT: 39.1 % (ref 35.0–47.0)
HGB: 12.3 g/dL (ref 12.0–16.0)
LYMPHS ABS: 1.4 x10 3/mm (ref 1.0–3.6)
Lymphocyte %: 17.5 %
MCH: 25.6 pg — AB (ref 26.0–34.0)
MCHC: 31.6 g/dL — ABNORMAL LOW (ref 32.0–36.0)
MCV: 81 fL (ref 80–100)
Monocyte #: 0.4 x10 3/mm (ref 0.2–0.9)
Monocyte %: 4.6 %
NEUTROS ABS: 6 x10 3/mm (ref 1.4–6.5)
Neutrophil %: 75.6 %
Platelet: 168 x10 3/mm (ref 150–440)
RBC: 4.81 10*6/uL (ref 3.80–5.20)
RDW: 14.1 % (ref 11.5–14.5)
WBC: 8 x10 3/mm (ref 3.6–11.0)

## 2014-11-18 LAB — COMPREHENSIVE METABOLIC PANEL
ALBUMIN: 3.7 g/dL (ref 3.4–5.0)
Alkaline Phosphatase: 369 U/L — ABNORMAL HIGH
Anion Gap: 10 (ref 7–16)
BUN: 16 mg/dL (ref 7–18)
Bilirubin,Total: 0.5 mg/dL (ref 0.2–1.0)
CHLORIDE: 99 mmol/L (ref 98–107)
Calcium, Total: 9.6 mg/dL (ref 8.5–10.1)
Co2: 30 mmol/L (ref 21–32)
Creatinine: 1.13 mg/dL (ref 0.60–1.30)
EGFR (Non-African Amer.): 50 — ABNORMAL LOW
GLUCOSE: 105 mg/dL — AB (ref 65–99)
Osmolality: 279 (ref 275–301)
Potassium: 3.8 mmol/L (ref 3.5–5.1)
SGOT(AST): 74 U/L — ABNORMAL HIGH (ref 15–37)
SGPT (ALT): 98 U/L — ABNORMAL HIGH
SODIUM: 139 mmol/L (ref 136–145)
TOTAL PROTEIN: 7.8 g/dL (ref 6.4–8.2)

## 2014-11-19 LAB — CEA: CEA: 187.1 ng/mL — ABNORMAL HIGH (ref 0.0–4.7)

## 2014-11-29 ENCOUNTER — Ambulatory Visit: Payer: Self-pay | Admitting: Oncology

## 2014-12-10 LAB — CBC CANCER CENTER
BASOS ABS: 0 x10 3/mm (ref 0.0–0.1)
Basophil %: 0.4 %
Eosinophil #: 0.1 x10 3/mm (ref 0.0–0.7)
Eosinophil %: 1.5 %
HCT: 38.7 % (ref 35.0–47.0)
HGB: 12.1 g/dL (ref 12.0–16.0)
LYMPHS ABS: 1.4 x10 3/mm (ref 1.0–3.6)
Lymphocyte %: 17.4 %
MCH: 25.4 pg — AB (ref 26.0–34.0)
MCHC: 31.4 g/dL — ABNORMAL LOW (ref 32.0–36.0)
MCV: 81 fL (ref 80–100)
MONOS PCT: 4.3 %
Monocyte #: 0.3 x10 3/mm (ref 0.2–0.9)
NEUTROS ABS: 6.1 x10 3/mm (ref 1.4–6.5)
Neutrophil %: 76.4 %
Platelet: 181 x10 3/mm (ref 150–440)
RBC: 4.77 10*6/uL (ref 3.80–5.20)
RDW: 13.8 % (ref 11.5–14.5)
WBC: 8 x10 3/mm (ref 3.6–11.0)

## 2014-12-10 LAB — COMPREHENSIVE METABOLIC PANEL
ANION GAP: 9 (ref 7–16)
AST: 66 U/L — AB (ref 15–37)
Albumin: 3.5 g/dL (ref 3.4–5.0)
Alkaline Phosphatase: 400 U/L — ABNORMAL HIGH
BUN: 21 mg/dL — AB (ref 7–18)
Bilirubin,Total: 0.4 mg/dL (ref 0.2–1.0)
CHLORIDE: 101 mmol/L (ref 98–107)
Calcium, Total: 9.1 mg/dL (ref 8.5–10.1)
Co2: 28 mmol/L (ref 21–32)
Creatinine: 1.24 mg/dL (ref 0.60–1.30)
EGFR (Non-African Amer.): 45 — ABNORMAL LOW
GFR CALC AF AMER: 55 — AB
Glucose: 89 mg/dL (ref 65–99)
Osmolality: 278 (ref 275–301)
Potassium: 3.7 mmol/L (ref 3.5–5.1)
SGPT (ALT): 71 U/L — ABNORMAL HIGH
SODIUM: 138 mmol/L (ref 136–145)
Total Protein: 7.5 g/dL (ref 6.4–8.2)

## 2014-12-11 LAB — CEA: CEA: 223.9 ng/mL — AB (ref 0.0–4.7)

## 2014-12-24 LAB — COMPREHENSIVE METABOLIC PANEL
ANION GAP: 13 (ref 7–16)
Albumin: 3.3 g/dL — ABNORMAL LOW (ref 3.4–5.0)
Alkaline Phosphatase: 346 U/L — ABNORMAL HIGH (ref 46–116)
BILIRUBIN TOTAL: 0.4 mg/dL (ref 0.2–1.0)
BUN: 13 mg/dL (ref 7–18)
CHLORIDE: 103 mmol/L (ref 98–107)
CO2: 26 mmol/L (ref 21–32)
CREATININE: 1.2 mg/dL (ref 0.60–1.30)
Calcium, Total: 9.2 mg/dL (ref 8.5–10.1)
GFR CALC AF AMER: 57 — AB
GFR CALC NON AF AMER: 47 — AB
Glucose: 109 mg/dL — ABNORMAL HIGH (ref 65–99)
Osmolality: 284 (ref 275–301)
Potassium: 3.4 mmol/L — ABNORMAL LOW (ref 3.5–5.1)
SGOT(AST): 51 U/L — ABNORMAL HIGH (ref 15–37)
SGPT (ALT): 67 U/L — ABNORMAL HIGH (ref 14–63)
Sodium: 142 mmol/L (ref 136–145)
Total Protein: 7.3 g/dL (ref 6.4–8.2)

## 2014-12-24 LAB — CBC CANCER CENTER
Basophil #: 0 x10 3/mm (ref 0.0–0.1)
Basophil %: 0.2 %
EOS PCT: 2.5 %
Eosinophil #: 0.1 x10 3/mm (ref 0.0–0.7)
HCT: 36.6 % (ref 35.0–47.0)
HGB: 11.6 g/dL — ABNORMAL LOW (ref 12.0–16.0)
LYMPHS ABS: 1.4 x10 3/mm (ref 1.0–3.6)
Lymphocyte %: 28.9 %
MCH: 25.8 pg — ABNORMAL LOW (ref 26.0–34.0)
MCHC: 31.7 g/dL — ABNORMAL LOW (ref 32.0–36.0)
MCV: 81 fL (ref 80–100)
Monocyte #: 0.3 x10 3/mm (ref 0.2–0.9)
Monocyte %: 5.5 %
NEUTROS ABS: 3.1 x10 3/mm (ref 1.4–6.5)
Neutrophil %: 62.9 %
Platelet: 173 x10 3/mm (ref 150–440)
RBC: 4.5 10*6/uL (ref 3.80–5.20)
RDW: 14 % (ref 11.5–14.5)
WBC: 4.9 x10 3/mm (ref 3.6–11.0)

## 2014-12-25 LAB — CEA: CEA: 213.9 ng/mL — ABNORMAL HIGH (ref 0.0–4.7)

## 2014-12-30 ENCOUNTER — Ambulatory Visit: Payer: Self-pay | Admitting: Oncology

## 2015-01-19 ENCOUNTER — Emergency Department: Payer: Self-pay | Admitting: Emergency Medicine

## 2015-01-28 ENCOUNTER — Ambulatory Visit
Admit: 2015-01-28 | Disposition: A | Discharge status: Home / Self Care | Payer: Self-pay | Attending: Oncology | Admitting: Oncology

## 2015-02-25 LAB — CBC CANCER CENTER
BASOS ABS: 0 x10 3/mm (ref 0.0–0.1)
Basophil %: 0.5 %
EOS PCT: 1.3 %
Eosinophil #: 0.1 x10 3/mm (ref 0.0–0.7)
HCT: 37.8 % (ref 35.0–47.0)
HGB: 12.1 g/dL (ref 12.0–16.0)
LYMPHS ABS: 1.4 x10 3/mm (ref 1.0–3.6)
Lymphocyte %: 16.1 %
MCH: 27.1 pg (ref 26.0–34.0)
MCHC: 32 g/dL (ref 32.0–36.0)
MCV: 85 fL (ref 80–100)
MONOS PCT: 6.9 %
Monocyte #: 0.6 x10 3/mm (ref 0.2–0.9)
NEUTROS ABS: 6.7 x10 3/mm — AB (ref 1.4–6.5)
Neutrophil %: 75.2 %
Platelet: 181 x10 3/mm (ref 150–440)
RBC: 4.47 10*6/uL (ref 3.80–5.20)
RDW: 16.9 % — AB (ref 11.5–14.5)
WBC: 8.9 x10 3/mm (ref 3.6–11.0)

## 2015-02-25 LAB — COMPREHENSIVE METABOLIC PANEL
ALK PHOS: 232 U/L — AB
AST: 58 U/L — AB
Albumin: 4.1 g/dL
Anion Gap: 5 — ABNORMAL LOW (ref 7–16)
BILIRUBIN TOTAL: 0.5 mg/dL
BUN: 27 mg/dL — ABNORMAL HIGH
CALCIUM: 9.2 mg/dL
CO2: 27 mmol/L
Chloride: 103 mmol/L
Creatinine: 1.09 mg/dL — ABNORMAL HIGH
EGFR (African American): 59 — ABNORMAL LOW
GFR CALC NON AF AMER: 51 — AB
Glucose: 107 mg/dL — ABNORMAL HIGH
Potassium: 3.4 mmol/L — ABNORMAL LOW
SGPT (ALT): 39 U/L
Sodium: 135 mmol/L
Total Protein: 7.5 g/dL

## 2015-02-25 LAB — CREATININE, SERUM: Creatine, Serum: 1.09

## 2015-02-25 LAB — PROTEIN, URINE, RANDOM: PROTEIN, URINE: 9 mg/dL (ref 0–9)

## 2015-02-26 LAB — CEA: CEA: 71.7 ng/mL — ABNORMAL HIGH (ref 0.0–4.7)

## 2015-02-28 ENCOUNTER — Ambulatory Visit
Admit: 2015-02-28 | Disposition: A | Discharge status: Home / Self Care | Payer: Self-pay | Attending: Oncology | Admitting: Oncology

## 2015-03-18 LAB — COMPREHENSIVE METABOLIC PANEL
Albumin: 3.7 g/dL
Alkaline Phosphatase: 189 U/L — ABNORMAL HIGH
Anion Gap: 9 (ref 7–16)
BILIRUBIN TOTAL: 0.3 mg/dL
BUN: 18 mg/dL
CREATININE: 1 mg/dL
Calcium, Total: 9.4 mg/dL
Chloride: 104 mmol/L
Co2: 27 mmol/L
GFR CALC NON AF AMER: 56 — AB
Glucose: 109 mg/dL — ABNORMAL HIGH
POTASSIUM: 3.5 mmol/L
SGOT(AST): 41 U/L
SGPT (ALT): 25 U/L
SODIUM: 140 mmol/L
Total Protein: 7 g/dL

## 2015-03-18 LAB — CBC CANCER CENTER
Basophil #: 0 x10 3/mm (ref 0.0–0.1)
Basophil %: 0.4 %
EOS PCT: 2.8 %
Eosinophil #: 0.1 x10 3/mm (ref 0.0–0.7)
HCT: 37.6 % (ref 35.0–47.0)
HGB: 11.9 g/dL — ABNORMAL LOW (ref 12.0–16.0)
LYMPHS PCT: 34.5 %
Lymphocyte #: 1.3 x10 3/mm (ref 1.0–3.6)
MCH: 26.5 pg (ref 26.0–34.0)
MCHC: 31.6 g/dL — ABNORMAL LOW (ref 32.0–36.0)
MCV: 84 fL (ref 80–100)
MONOS PCT: 10.5 %
Monocyte #: 0.4 x10 3/mm (ref 0.2–0.9)
Neutrophil #: 2 x10 3/mm (ref 1.4–6.5)
Neutrophil %: 51.8 %
PLATELETS: 166 x10 3/mm (ref 150–440)
RBC: 4.49 10*6/uL (ref 3.80–5.20)
RDW: 16 % — ABNORMAL HIGH (ref 11.5–14.5)
WBC: 3.9 x10 3/mm (ref 3.6–11.0)

## 2015-03-19 LAB — CEA: CEA: 60.7 ng/mL — ABNORMAL HIGH (ref 0.0–4.7)

## 2015-03-21 NOTE — Op Note (Signed)
PATIENT NAME:  Christina Walker, Christina Walker MR#:  [id number] DATE OF BIRTH:  1941-08-18  DATE OF PROCEDURE:  11/16/2013  PREOPERATIVE DIAGNOSIS: Cancer of the sigmoid colon.   POSTOPERATIVE DIAGNOSIS: Cancer of the sigmoid colon.   PROCEDURE: Sigmoid colectomy.   SURGEON: Rochel Brome, M.D.   ANESTHESIA: General.   INDICATIONS: This 74 year old female had recent findings of anemia and colonoscopy findings of a circumferential mass of the sigmoid colon. She had CT findings of this mass and also nodules in her liver, elevated CEA. Surgery was recommended to avoid future obstruction and as definitive treatment for the colon.   DESCRIPTION OF PROCEDURE: The patient was placed on the operating table in the supine position under general anesthesia. The circulating nurse inserted a Foley urinary catheter with Betadine preparation of the perineum, draining urine which did have some sediment. The patient was placed in lithotomy position. The rigid sigmoidoscope was inserted into the rectum and advanced down some 20 cm, and did not reached the point of the cancer. The mucosa appeared normal as the scope was gradually pulled back and removed.   Next, the patient was placed in the supine position. The abdomen was clipped and then prepared with ChloraPrep, draped in a sterile manner.   A lower abdominal midline incision was made, from just to the left of the umbilicus down to the pubic symphysis, carried down through subcutaneous tissues. Numerous small bleeding points were cauterized. The midline fascia was incised. There was some scarring from a previous hysterectomy. The peritoneal cavity was opened. There were multiple adhesions between the anterior abdominal wall and the omentum. These were taken down with sharp dissection.   Next, the mass was identified in the mid aspect of the sigmoid colon. It was somewhat to the right of the midline, and it was hard, and measured some 4 to 5 cm in dimension and was  circumferential. There were multiple adhesions which were taken down with sharp dissection. Next, the sigmoid colon was further mobilized with incision of the lateral peritoneal reflection. As this was mobilized, I did palpate some firm lymph nodes within the mesentery. The rest of the colon was palpated, and I did not feel any other mass. There was a palpable nodule in the anterior portion of the left lobe of the liver, left of the falciform ligament. This nodule was about a centimeter; I could not see it, though. There was no other grossly palpable mass within the liver. There was no other palpable mass within the abdominal cavity.   A Balfour retractor was inserted and also a bladder blade for further exposure. The proximal point of resection was selected in the upper aspect of the sigmoid colon and also a distal point which was approximately some 8 cm distal to the palpable mass. The bowel was divided proximally and distally at each site with the GIA 75 stapler. Mesenteric dissection was carried out with the Harmonic scalpel, palpating some firm lymph nodes as the mesentery was dissected. The inferior mesenteric artery and vein were ligated with 0 chromic and divided with the Harmonic scalpel, and the mass was labeled so that the proximal end was tagged with a stitch for the pathologist's orientation.  It is noted there were a number of bleeding point related to the lysis of adhesions. There was one bleeding point in the right lower quadrant in the pelvis which was suture ligated with 4-0 Vicryl. Some blood was aspirated from the pelvis. Hemostasis subsequently appeared to be intact.   Next, the  anastomosis was carried out with the triangulation technique. Each of the staple lines were excised, and the bowel edges were held with Allis clamps. The anastomosis was begun along the posterior border, with the staple line on the mucosa side. The anastomosis was completed with two additional applications of the  TA 30, placing the staples on the outside of the colon. The anastomosis looked good and was widely patent.   The mesenteric defect was closed with interrupted 4-0 Vicryl sutures, and also there were several points in the anterior aspect of the anastomosis that were imbricated with 4-0 Vicryl. The pelvis was irrigated with warm saline solution and aspirated. Hemostasis subsequently appeared to be intact. Lap packs were removed and count was correct.   Next gown, gloves and instruments, and towels, suction and cautery were exchanged for clean ones. Next, the fascia was closed with interrupted 0 Maxon figure-of-eight sutures. It is noted that the omentum was directly below the closure. The skin was closed with clips. Dressings were applied with paper tape. The patient appeared to tolerate the procedure satisfactorily and was prepared for transfer to the recovery room.   ____________________________ Lenna Sciara. Rochel Brome, MD jws:cg D: 11/16/2013 16:36:50 ET T: 11/17/2013 01:13:15 ET JOB#: [id number]  cc: Loreli Dollar, MD, <Dictator> Loreli Dollar MD ELECTRONICALLY SIGNED 11/21/2013 17:54

## 2015-03-22 NOTE — Op Note (Signed)
PATIENT NAME:  Christina Walker, Christina Walker MR#:  [id number] DATE OF BIRTH:  September 05, 1941  DATE OF PROCEDURE:  01/01/2014  PREOPERATIVE DIAGNOSIS: Carcinoma of the colon.   POSTOPERATIVE DIAGNOSIS: Carcinoma of the colon.   PROCEDURE: Insertion of central venous catheter with subcutaneous infusion port.   SURGEON: Rochel Brome, M.D.   ANESTHESIA: Local 1% Xylocaine with monitored anesthesia care.   INDICATIONS: This 74 year old female had recent surgery for cancer of the colon and now needing central venous access for chemotherapy.   DESCRIPTION OF PROCEDURE: The patient was placed on the operating table in the supine position and sedated and monitored by the anesthesia staff. A rolled sheet was placed behind her shoulder blades. The neck was extended. The head was turned 20 degrees to the left. Ultrasound was used to image the jugular vein and carotid artery. Subsequently, the site was prepared with ChloraPrep and draped in a sterile manner.   The skin beneath the clavicle on the right side was infiltrated with 1% Xylocaine. A transversely oriented 3 cm incision was made and carried down through subcutaneous tissues. A subcutaneous pouch was created just anterior to the deep fascia and inferior to the incision large enough to admit the [place] port. Next, the jugular vein was further examined with ultrasound and had a typical appearance. Carotid artery is again noted. Next, the skin overlying the jugular vein was infiltrated with 1% Xylocaine. A transversely oriented 5 mm incision was made and carried down through subcutaneous tissues. Next, with the patient in the Trendelenburg position and using ultrasound guidance, a needle was inserted into the jugular vein and a guidewire was advanced down into the central circulation. The needle was withdrawn. The dilator and introducer sheath were advanced over the guidewire using fluoroscopy to demonstrate the presence of the guidewire in the vena cava. I removed the  guidewire and removed the dilator and inserted the catheter and peeled away the sheath. The catheter was positioned in the superior vena cava as seen with fluoroscopy. It is noted that an ultrasound image was saved for the paper chart and a fluoroscopic image was saved for the paper chart so that the tip was in the vena cava and 10 cm from the cervical incision.   Next, the catheter was tunneled down to the subclavian incision. Pressure was held over the tunnel site. The catheter was cut to fit and attached to the Xcela port and the port was accessed with a Huber needle, aspirated a trace of blood and flushed with heparinized saline solution. The port was placed into the subcutaneous pouch and was sutured to the deep fascia with 4-0 silk suture. Next, the pouch was closed with interrupted 5-0 Vicryl.   It is further noted that during the course of the procedure, a number of small bleeding points were cauterized. Hemostasis was subsequently intact. Next, the subcutaneous tissues were closed with interrupted 5-0 Vicryl and then both incisions were closed with 5-0 Vicryl subcuticular suture and Dermabond. The patient tolerated surgery satisfactorily and was prepared for transfer to the recovery room.   ____________________________ Lenna Sciara. Rochel Brome, MD jws:aw D: 01/01/2014 10:49:35 ET T: 01/01/2014 11:56:45 ET JOB#: [id number]  cc: Loreli Dollar, MD, <Dictator> Loreli Dollar MD ELECTRONICALLY SIGNED 01/08/2014 19:56

## 2015-03-22 NOTE — Discharge Summary (Signed)
PATIENT NAME:  Christina Walker, Christina Walker MR#:  [id number] DATE OF BIRTH:  12-03-1940  DATE OF ADMISSION:  11/16/2013 DATE OF DISCHARGE:  11/20/2013  HISTORY OF PRESENT ILLNESS: This 74 year old female was admitted for elective sigmoid resection. She had recent findings of anemia and had colonoscopy which demonstrated a circumferential, partially obstructing mass 30 cm proximal to the anus.   PAST MEDICAL HISTORY: Includes hypertension, hyperlipidemia.   PAST SURGICAL HISTORY: Included total abdominal hysterectomy, bilateral salpingo-oophorectomy and laparoscopic cholecystectomy.   HOSPITAL COURSE:  Details are recorded on the typed H and P. She did have bowel preparation at home.   She was brought in through the outpatient surgery department and carried to the Operating Room where she had a sigmoid colectomy with findings of a circumferential cancer of the sigmoid colon. She did have a preop prophylactic antibiotic. She did have prophylactic subcutaneous heparin while in the hospital. She was initially begun on a clear liquid diet and gradually advanced to a full liquid and later to solid food, which she tolerated satisfactorily, did demonstrate bowel function prior to discharge.   Her pathology did demonstrate cancer of the sigmoid colon. Tumor size was 3.2 cm. The tumor was moderately differentiated and did extend to the visceral serosa. There was metastatic disease found in 2 of 19 regional mesenteric lymph nodes.  It was also noted that her CT scan did demonstrate 3 nodules within the liver which has been discussed with the patient preop.   DIAGNOSIS: Carcinoma of the sigmoid colon.   Discharge instructions were given and plans made for followup in the office for clip removal and also anticipate oncology consultation.   ____________________________ Lenna Sciara. Rochel Brome, MD jws:cs D: 11/28/2013 16:53:16 ET T: 11/28/2013 19:50:16 ET JOB#: [id number]  cc: Loreli Dollar, MD, <Dictator> Loreli Dollar  MD ELECTRONICALLY SIGNED 11/29/2013 19:24

## 2015-04-04 ENCOUNTER — Other Ambulatory Visit: Payer: Self-pay | Admitting: Oncology

## 2015-04-04 DIAGNOSIS — C189 Malignant neoplasm of colon, unspecified: Secondary | ICD-10-CM

## 2015-04-11 ENCOUNTER — Inpatient Hospital Stay: Payer: Medicare Other | Attending: Oncology

## 2015-04-11 DIAGNOSIS — Z79899 Other long term (current) drug therapy: Secondary | ICD-10-CM | POA: Diagnosis not present

## 2015-04-11 DIAGNOSIS — G8929 Other chronic pain: Secondary | ICD-10-CM | POA: Diagnosis not present

## 2015-04-11 DIAGNOSIS — R5383 Other fatigue: Secondary | ICD-10-CM | POA: Diagnosis not present

## 2015-04-11 DIAGNOSIS — Z85038 Personal history of other malignant neoplasm of large intestine: Secondary | ICD-10-CM | POA: Diagnosis not present

## 2015-04-11 DIAGNOSIS — G620 Drug-induced polyneuropathy: Secondary | ICD-10-CM | POA: Diagnosis not present

## 2015-04-11 DIAGNOSIS — C787 Secondary malignant neoplasm of liver and intrahepatic bile duct: Secondary | ICD-10-CM | POA: Diagnosis not present

## 2015-04-11 DIAGNOSIS — R197 Diarrhea, unspecified: Secondary | ICD-10-CM | POA: Insufficient documentation

## 2015-04-11 DIAGNOSIS — M549 Dorsalgia, unspecified: Secondary | ICD-10-CM | POA: Diagnosis not present

## 2015-04-11 DIAGNOSIS — R5381 Other malaise: Secondary | ICD-10-CM | POA: Diagnosis not present

## 2015-04-11 DIAGNOSIS — C7800 Secondary malignant neoplasm of unspecified lung: Secondary | ICD-10-CM | POA: Diagnosis not present

## 2015-04-11 DIAGNOSIS — Z5111 Encounter for antineoplastic chemotherapy: Secondary | ICD-10-CM | POA: Diagnosis not present

## 2015-04-11 DIAGNOSIS — R0602 Shortness of breath: Secondary | ICD-10-CM | POA: Insufficient documentation

## 2015-04-11 DIAGNOSIS — C189 Malignant neoplasm of colon, unspecified: Secondary | ICD-10-CM | POA: Insufficient documentation

## 2015-04-11 LAB — COMPREHENSIVE METABOLIC PANEL
ALT: 87 U/L — AB (ref 14–54)
AST: 96 U/L — ABNORMAL HIGH (ref 15–41)
Albumin: 4 g/dL (ref 3.5–5.0)
Alkaline Phosphatase: 224 U/L — ABNORMAL HIGH (ref 38–126)
Anion gap: 8 (ref 5–15)
BUN: 21 mg/dL — ABNORMAL HIGH (ref 6–20)
CALCIUM: 9.9 mg/dL (ref 8.9–10.3)
CHLORIDE: 103 mmol/L (ref 101–111)
CO2: 29 mmol/L (ref 22–32)
Creatinine, Ser: 0.94 mg/dL (ref 0.44–1.00)
GFR calc Af Amer: >60 mL/min (ref >60)
GFR calc non Af Amer: 59 mL/min — ABNORMAL LOW (ref >60)
GLUCOSE: 97 mg/dL (ref 65–99)
POTASSIUM: 3.6 mmol/L (ref 3.5–5.1)
SODIUM: 140 mmol/L (ref 135–145)
Total Bilirubin: 0.6 mg/dL (ref 0.3–1.2)
Total Protein: 7.6 g/dL (ref 6.5–8.1)

## 2015-04-11 LAB — CBC WITH DIFFERENTIAL/PLATELET
BASOS ABS: 0 10*3/uL (ref 0–0.1)
Basophils Relative: 0 %
EOS ABS: 0.2 10*3/uL (ref 0–0.7)
EOS PCT: 4 %
HCT: 40.6 % (ref 35.0–47.0)
Hemoglobin: 12.6 g/dL (ref 12.0–16.0)
LYMPHS ABS: 1.4 10*3/uL (ref 1.0–3.6)
Lymphocytes Relative: 21 %
MCH: 25.6 pg — ABNORMAL LOW (ref 26.0–34.0)
MCHC: 31 g/dL — AB (ref 32.0–36.0)
MCV: 82.8 fL (ref 80.0–100.0)
Monocytes Absolute: 0.4 10*3/uL (ref 0.2–0.9)
Monocytes Relative: 6 %
NEUTROS PCT: 69 %
Neutro Abs: 4.6 10*3/uL (ref 1.4–6.5)
PLATELETS: 162 10*3/uL (ref 150–440)
RBC: 4.91 MIL/uL (ref 3.80–5.20)
RDW: 15.6 % — AB (ref 11.5–14.5)
WBC: 6.6 10*3/uL (ref 3.6–11.0)

## 2015-04-12 LAB — CEA: CEA: 92.3 ng/mL — ABNORMAL HIGH (ref 0.0–4.7)

## 2015-04-13 ENCOUNTER — Other Ambulatory Visit: Payer: Self-pay | Admitting: Oncology

## 2015-04-13 DIAGNOSIS — C189 Malignant neoplasm of colon, unspecified: Secondary | ICD-10-CM | POA: Insufficient documentation

## 2015-04-15 ENCOUNTER — Inpatient Hospital Stay: Payer: Medicare Other

## 2015-04-15 ENCOUNTER — Inpatient Hospital Stay (HOSPITAL_BASED_OUTPATIENT_CLINIC_OR_DEPARTMENT_OTHER): Payer: Medicare Other | Admitting: Oncology

## 2015-04-15 ENCOUNTER — Encounter (INDEPENDENT_AMBULATORY_CARE_PROVIDER_SITE_OTHER): Payer: Self-pay

## 2015-04-15 VITALS — BP 125/78 | HR 76 | Temp 96.5°F | Resp 18 | Wt 151.5 lb

## 2015-04-15 VITALS — BP 147/81 | HR 77 | Temp 96.0°F | Resp 18

## 2015-04-15 DIAGNOSIS — C189 Malignant neoplasm of colon, unspecified: Secondary | ICD-10-CM

## 2015-04-15 DIAGNOSIS — R197 Diarrhea, unspecified: Secondary | ICD-10-CM

## 2015-04-15 DIAGNOSIS — G8929 Other chronic pain: Secondary | ICD-10-CM

## 2015-04-15 DIAGNOSIS — Z85038 Personal history of other malignant neoplasm of large intestine: Secondary | ICD-10-CM | POA: Diagnosis not present

## 2015-04-15 DIAGNOSIS — R5381 Other malaise: Secondary | ICD-10-CM

## 2015-04-15 DIAGNOSIS — C7800 Secondary malignant neoplasm of unspecified lung: Secondary | ICD-10-CM | POA: Diagnosis not present

## 2015-04-15 DIAGNOSIS — R5383 Other fatigue: Secondary | ICD-10-CM

## 2015-04-15 DIAGNOSIS — Z5111 Encounter for antineoplastic chemotherapy: Secondary | ICD-10-CM | POA: Diagnosis not present

## 2015-04-15 DIAGNOSIS — C787 Secondary malignant neoplasm of liver and intrahepatic bile duct: Secondary | ICD-10-CM | POA: Diagnosis not present

## 2015-04-15 DIAGNOSIS — M549 Dorsalgia, unspecified: Secondary | ICD-10-CM

## 2015-04-15 DIAGNOSIS — R0602 Shortness of breath: Secondary | ICD-10-CM

## 2015-04-15 DIAGNOSIS — Z79899 Other long term (current) drug therapy: Secondary | ICD-10-CM

## 2015-04-15 DIAGNOSIS — G620 Drug-induced polyneuropathy: Secondary | ICD-10-CM

## 2015-04-15 MED ORDER — SODIUM CHLORIDE 0.9 % IV SOLN
Freq: Once | INTRAVENOUS | Status: AC
  Administered 2015-04-15: 11:00:00 via INTRAVENOUS
  Filled 2015-04-15: qty 250

## 2015-04-15 MED ORDER — IRINOTECAN HCL CHEMO INJECTION 100 MG/5ML
280.0000 mg | Freq: Once | INTRAVENOUS | Status: AC
  Administered 2015-04-15: 280 mg via INTRAVENOUS
  Filled 2015-04-15: qty 4.67

## 2015-04-15 MED ORDER — SODIUM CHLORIDE 0.9 % IV SOLN
5.0000 mg/kg | Freq: Once | INTRAVENOUS | Status: AC
  Administered 2015-04-15: 325 mg via INTRAVENOUS
  Filled 2015-04-15: qty 13

## 2015-04-15 MED ORDER — SODIUM CHLORIDE 0.9 % IV SOLN
Freq: Once | INTRAVENOUS | Status: AC
  Administered 2015-04-15: 11:00:00 via INTRAVENOUS
  Filled 2015-04-15: qty 5

## 2015-04-15 MED ORDER — LEUCOVORIN CALCIUM INJECTION 350 MG
600.0000 mg | Freq: Once | INTRAMUSCULAR | Status: AC
  Administered 2015-04-15: 600 mg via INTRAVENOUS
  Filled 2015-04-15: qty 25

## 2015-04-15 MED ORDER — SODIUM CHLORIDE 0.9 % IJ SOLN
10.0000 mL | INTRAMUSCULAR | Status: DC | PRN
  Filled 2015-04-15: qty 10

## 2015-04-15 MED ORDER — SODIUM CHLORIDE 0.9 % IV SOLN: Freq: Once | INTRAVENOUS | Status: DC

## 2015-04-15 MED ORDER — PALONOSETRON HCL INJECTION 0.25 MG/5ML
0.2500 mg | Freq: Once | INTRAVENOUS | Status: AC
  Administered 2015-04-15: 0.25 mg via INTRAVENOUS
  Filled 2015-04-15: qty 5

## 2015-04-15 MED ORDER — FLUOROURACIL CHEMO INJECTION 2.5 GM/50ML
360.0000 mg/m2 | Freq: Once | INTRAVENOUS | Status: AC
  Administered 2015-04-15: 600 mg via INTRAVENOUS
  Filled 2015-04-15: qty 12

## 2015-04-15 MED ORDER — SODIUM CHLORIDE 0.9 % IV SOLN
2150.0000 mg/m2 | INTRAVENOUS | Status: AC
  Administered 2015-04-15: 3650 mg via INTRAVENOUS
  Filled 2015-04-15: qty 73

## 2015-04-15 MED ORDER — HEPARIN SOD (PORK) LOCK FLUSH 100 UNIT/ML IV SOLN
500.0000 [IU] | Freq: Once | INTRAVENOUS | Status: AC | PRN
  Filled 2015-04-15: qty 5

## 2015-04-15 MED ORDER — ATROPINE SULFATE 0.4 MG/ML IJ SOLN
0.4000 mg | Freq: Once | INTRAMUSCULAR | Status: AC | PRN
  Administered 2015-04-15: 0.4 mg via INTRAVENOUS
  Filled 2015-04-15 (×2): qty 1

## 2015-04-17 ENCOUNTER — Inpatient Hospital Stay: Payer: Medicare Other

## 2015-04-17 VITALS — BP 120/76 | HR 60 | Temp 96.0°F | Resp 18

## 2015-04-17 DIAGNOSIS — C189 Malignant neoplasm of colon, unspecified: Secondary | ICD-10-CM

## 2015-04-17 DIAGNOSIS — Z5111 Encounter for antineoplastic chemotherapy: Secondary | ICD-10-CM | POA: Diagnosis not present

## 2015-04-17 MED ORDER — HEPARIN SOD (PORK) LOCK FLUSH 100 UNIT/ML IV SOLN
500.0000 [IU] | Freq: Once | INTRAVENOUS | Status: AC | PRN
  Administered 2015-04-17: 500 [IU]
  Filled 2015-04-17: qty 5

## 2015-04-17 MED ORDER — SODIUM CHLORIDE 0.9 % IJ SOLN
10.0000 mL | INTRAMUSCULAR | Status: DC | PRN
  Administered 2015-04-17: 10 mL
  Filled 2015-04-17: qty 10

## 2015-04-22 ENCOUNTER — Telehealth: Payer: Self-pay | Admitting: *Deleted

## 2015-04-22 ENCOUNTER — Other Ambulatory Visit: Payer: Self-pay | Admitting: Oncology

## 2015-04-22 NOTE — Telephone Encounter (Addendum)
Stomach sore since given last chemo, can't eat much, but does feel hungry for the first time in a week this morning.tingling in her toes, left thigh cramping intermittently, Urine very dark, brownish colored. No fever, no abdominal swelling.Denies vomiting

## 2015-04-22 NOTE — Telephone Encounter (Signed)
Call back if gets worse, nothing to do right now since she is starting to feel better today. Message left on patients answering machine as requested by her because she had some errands to run

## 2015-05-01 NOTE — Progress Notes (Signed)
[place]  Telephone:([phone] [phone]) [phone]  ID: Christina Walker OB: December 21, 1940  MR#: [id number]  GYI#:[id number]  Patient Care Team: Tracie Harrier, MD as PCP - General (Internal Medicine)  CHIEF COMPLAINT:  Chief Complaint  Patient presents with  . Follow-up    colon cancer  . Chemotherapy    INTERVAL HISTORY: Patient returns to clinic today for consideration of reinitiating chemotherapy which would be considered cycle 6 of FOLFIRI plus Avastin. She continues to have peripheral neuropathy but admits it is slightly improved. She has multiple other complaints that are chronic and unchanged as well. She continues to have chronic back pain. She has no other neurologic complaints. She has a fair appetite, but her weight has remained stable.  She denies any recent fevers. She has no chest pain or shortness of breath. She denies any vomiting, constipation, or diarrhea. She has no melena or hematochezia. She has no urinary complaints. Patient offers no further specific complaints today.  REVIEW OF SYSTEMS:   Review of Systems  Constitutional: Positive for malaise/fatigue.  Respiratory: Positive for shortness of breath.   Gastrointestinal: Positive for diarrhea. Negative for nausea and vomiting.  Neurological: Positive for sensory change and weakness.    As per HPI. Otherwise, a complete review of systems is negatve.  PAST MEDICAL HISTORY: Past Medical History  Diagnosis Date  . Colon cancer   . Back pain   . Anemia   . Hypertension     PAST SURGICAL HISTORY: Past Surgical History  Procedure Laterality Date  . Cholecystectomy    . Abdominal hysterectomy      FAMILY HISTORY: Father with prostate cancer.     ADVANCED DIRECTIVES:    HEALTH MAINTENANCE: History  Substance Use Topics  . Smoking status: Never Smoker   . Smokeless tobacco: Not on file  . Alcohol Use: Not on file     Colonoscopy:  PAP:  Bone density:  Lipid  panel:  Allergies  Allergen Reactions  . No Known Allergies     Current Outpatient Prescriptions  Medication Sig Dispense Refill  . acetaminophen (TYLENOL) 325 MG tablet Take 650 mg by mouth every 4 (four) hours as needed.    . cyanocobalamin 500 MCG tablet Take 500 mcg by mouth daily.    . diazepam (VALIUM) 5 MG tablet Take 5 mg by mouth daily as needed for anxiety (at bedtime).    . ondansetron (ZOFRAN-ODT) 4 MG disintegrating tablet Take 4 mg by mouth every 8 (eight) hours as needed for nausea or vomiting.    . potassium chloride (K-DUR,KLOR-CON) 10 MEQ tablet Take 10 mEq by mouth daily.    Marland Kitchen triamterene-hydrochlorothiazide (MAXZIDE-25) 37.5-25 MG per tablet Take 1 tablet by mouth daily.    . potassium chloride (K-DUR) 10 MEQ tablet TAKE ONE TABLET TWICE DAILY 60 tablet 2  . simvastatin (ZOCOR) 20 MG tablet Take 20 mg by mouth daily.     No current facility-administered medications for this visit.   Facility-Administered Medications Ordered in Other Visits  Medication Dose Route Frequency Provider Last Rate Last Dose  . sodium chloride 0.9 % injection 10 mL  10 mL Intracatheter PRN Lloyd Huger, MD        OBJECTIVE: Filed Vitals:   04/15/15 0945  BP: 125/78  Pulse: 76  Temp: 96.5 F (35.8 C)  Resp: 18     Body mass index is 28.23 kg/(m^2).    ECOG FS:1 - Symptomatic but completely ambulatory  General: Well-developed, well-nourished, no acute distress.  Eyes: anicteric sclera. Lungs: Clear to auscultation bilaterally. Heart: Regular rate and rhythm. No rubs, murmurs, or gallops. Abdomen: Soft, nontender, nondistended. No organomegaly noted, normoactive bowel sounds. Musculoskeletal: No edema, cyanosis, or clubbing. Neuro: Alert, answering all questions appropriately. Cranial nerves grossly intact. Skin: No rashes or petechiae noted. Psych: Normal affect.   LAB RESULTS:  Lab Results  Component Value Date   NA 140 04/11/2015   K 3.6 04/11/2015   CL 103  04/11/2015   CO2 29 04/11/2015   GLUCOSE 97 04/11/2015   BUN 21* 04/11/2015   CREATININE 0.94 04/11/2015   CALCIUM 9.9 04/11/2015   PROT 7.6 04/11/2015   ALBUMIN 4.0 04/11/2015   AST 96* 04/11/2015   ALT 87* 04/11/2015   ALKPHOS 224* 04/11/2015   BILITOT 0.6 04/11/2015   GFRNONAA 59* 04/11/2015   GFRAA >60 04/11/2015    Lab Results  Component Value Date   WBC 6.6 04/11/2015   NEUTROABS 4.6 04/11/2015   HGB 12.6 04/11/2015   HCT 40.6 04/11/2015   MCV 82.8 04/11/2015   PLT 162 04/11/2015     STUDIES: No results found.  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  PLAN:    1. Colon cancer: CT scan results reviewed independently with improvement of disease burden. Patient's CEA has risen slightly to 92.3, but she has not had chemotherapy for greater than one month. Proceed with cycle 6 of palliative chemotherapy with FOLFIRI plus Avastin. Previously, irinotecan was dose reduced secondary to neuropathy. Patient has requested less frequent treatments and now will receive her infusions every 3 weeks.  Patient expressed understanding and was in agreement with this plan. 2. Appetite: Continue Megace as needed. 3. Diarrhea: Patient does not complain of this today. Continue OTC Imodium as needed. 4. Pancytopenia: Resolved. 5. Peripheral neuropathy:  Patient does not wish to try gabapentin at this time, but has agreed if her neuropathy becomes painful. Monitor.  6. Weakness and fatigue: Mild. Secondary to chemotherapy, monitor. 7. Back pain: Chronic.  Patient expressed understanding and was in agreement with this plan. She also understands that She can call clinic at any time with any questions, concerns, or complaints.   No matching staging information was found for the patient.  Lloyd Huger, MD   05/01/2015 3:45 PM

## 2015-05-04 ENCOUNTER — Other Ambulatory Visit: Payer: Self-pay | Admitting: Oncology

## 2015-05-06 ENCOUNTER — Inpatient Hospital Stay: Payer: Medicare Other

## 2015-05-06 ENCOUNTER — Inpatient Hospital Stay (HOSPITAL_BASED_OUTPATIENT_CLINIC_OR_DEPARTMENT_OTHER): Payer: Medicare Other | Admitting: Oncology

## 2015-05-06 ENCOUNTER — Inpatient Hospital Stay: Payer: Medicare Other | Attending: Oncology

## 2015-05-06 VITALS — BP 134/78 | HR 60 | Temp 95.7°F | Resp 18 | Wt 143.5 lb

## 2015-05-06 DIAGNOSIS — G8929 Other chronic pain: Secondary | ICD-10-CM | POA: Diagnosis not present

## 2015-05-06 DIAGNOSIS — M549 Dorsalgia, unspecified: Secondary | ICD-10-CM | POA: Diagnosis not present

## 2015-05-06 DIAGNOSIS — C7800 Secondary malignant neoplasm of unspecified lung: Secondary | ICD-10-CM

## 2015-05-06 DIAGNOSIS — R5383 Other fatigue: Secondary | ICD-10-CM | POA: Diagnosis not present

## 2015-05-06 DIAGNOSIS — K838 Other specified diseases of biliary tract: Secondary | ICD-10-CM | POA: Diagnosis not present

## 2015-05-06 DIAGNOSIS — C189 Malignant neoplasm of colon, unspecified: Secondary | ICD-10-CM | POA: Diagnosis present

## 2015-05-06 DIAGNOSIS — R140 Abdominal distension (gaseous): Secondary | ICD-10-CM | POA: Diagnosis not present

## 2015-05-06 DIAGNOSIS — C787 Secondary malignant neoplasm of liver and intrahepatic bile duct: Secondary | ICD-10-CM | POA: Insufficient documentation

## 2015-05-06 DIAGNOSIS — Z79899 Other long term (current) drug therapy: Secondary | ICD-10-CM | POA: Diagnosis not present

## 2015-05-06 DIAGNOSIS — R531 Weakness: Secondary | ICD-10-CM | POA: Insufficient documentation

## 2015-05-06 DIAGNOSIS — Z452 Encounter for adjustment and management of vascular access device: Secondary | ICD-10-CM | POA: Insufficient documentation

## 2015-05-06 DIAGNOSIS — G629 Polyneuropathy, unspecified: Secondary | ICD-10-CM | POA: Diagnosis not present

## 2015-05-06 DIAGNOSIS — R110 Nausea: Secondary | ICD-10-CM | POA: Diagnosis not present

## 2015-05-06 DIAGNOSIS — R109 Unspecified abdominal pain: Secondary | ICD-10-CM | POA: Insufficient documentation

## 2015-05-06 DIAGNOSIS — R748 Abnormal levels of other serum enzymes: Secondary | ICD-10-CM | POA: Insufficient documentation

## 2015-05-06 DIAGNOSIS — R630 Anorexia: Secondary | ICD-10-CM | POA: Insufficient documentation

## 2015-05-06 DIAGNOSIS — I10 Essential (primary) hypertension: Secondary | ICD-10-CM | POA: Diagnosis not present

## 2015-05-06 DIAGNOSIS — R5381 Other malaise: Secondary | ICD-10-CM | POA: Insufficient documentation

## 2015-05-06 DIAGNOSIS — R1084 Generalized abdominal pain: Secondary | ICD-10-CM

## 2015-05-06 LAB — COMPREHENSIVE METABOLIC PANEL
ALT: 142 U/L — ABNORMAL HIGH (ref 14–54)
AST: 160 U/L — ABNORMAL HIGH (ref 15–41)
Albumin: 3.7 g/dL (ref 3.5–5.0)
Alkaline Phosphatase: 676 U/L — ABNORMAL HIGH (ref 38–126)
Anion gap: 8 (ref 5–15)
BUN: 18 mg/dL (ref 6–20)
CALCIUM: 8.9 mg/dL (ref 8.9–10.3)
CO2: 25 mmol/L (ref 22–32)
CREATININE: 1.03 mg/dL — AB (ref 0.44–1.00)
Chloride: 102 mmol/L (ref 101–111)
GFR calc Af Amer: >60 mL/min (ref >60)
GFR calc non Af Amer: 53 mL/min — ABNORMAL LOW (ref >60)
GLUCOSE: 93 mg/dL (ref 65–99)
Potassium: 3.1 mmol/L — ABNORMAL LOW (ref 3.5–5.1)
Sodium: 135 mmol/L (ref 135–145)
Total Bilirubin: 1.9 mg/dL — ABNORMAL HIGH (ref 0.3–1.2)
Total Protein: 7.4 g/dL (ref 6.5–8.1)

## 2015-05-06 LAB — CBC WITH DIFFERENTIAL/PLATELET
Basophils Absolute: 0 10*3/uL (ref 0–0.1)
Basophils Relative: 0 %
EOS ABS: 0.1 10*3/uL (ref 0–0.7)
Eosinophils Relative: 1 %
HCT: 37.9 % (ref 35.0–47.0)
HEMOGLOBIN: 11.9 g/dL — AB (ref 12.0–16.0)
LYMPHS ABS: 1.3 10*3/uL (ref 1.0–3.6)
Lymphocytes Relative: 28 %
MCH: 25.9 pg — ABNORMAL LOW (ref 26.0–34.0)
MCHC: 31.5 g/dL — AB (ref 32.0–36.0)
MCV: 82.2 fL (ref 80.0–100.0)
MONO ABS: 0.5 10*3/uL (ref 0.2–0.9)
MONOS PCT: 12 %
Neutro Abs: 2.7 10*3/uL (ref 1.4–6.5)
Neutrophils Relative %: 59 %
Platelets: 207 10*3/uL (ref 150–440)
RBC: 4.6 MIL/uL (ref 3.80–5.20)
RDW: 16.4 % — AB (ref 11.5–14.5)
WBC: 4.5 10*3/uL (ref 3.6–11.0)

## 2015-05-06 LAB — AMYLASE: Amylase: 83 U/L (ref 28–100)

## 2015-05-06 LAB — LIPASE, BLOOD: Lipase: 46 U/L (ref 22–51)

## 2015-05-06 MED ORDER — HEPARIN SOD (PORK) LOCK FLUSH 100 UNIT/ML IV SOLN
500.0000 [IU] | Freq: Once | INTRAVENOUS | Status: AC
  Administered 2015-05-06: 500 [IU] via INTRAVENOUS
  Filled 2015-05-06: qty 5

## 2015-05-06 MED ORDER — SODIUM CHLORIDE 0.9 % IJ SOLN
10.0000 mL | Freq: Once | INTRAMUSCULAR | Status: AC
  Administered 2015-05-06: 10 mL via INTRAVENOUS
  Filled 2015-05-06: qty 10

## 2015-05-07 ENCOUNTER — Ambulatory Visit
Admission: RE | Admit: 2015-05-07 | Discharge: 2015-05-07 | Disposition: A | Discharge status: Home / Self Care | Payer: Medicare Other | Source: Ambulatory Visit | Attending: Oncology | Admitting: Oncology

## 2015-05-07 DIAGNOSIS — K7689 Other specified diseases of liver: Secondary | ICD-10-CM | POA: Diagnosis not present

## 2015-05-07 DIAGNOSIS — R1084 Generalized abdominal pain: Secondary | ICD-10-CM

## 2015-05-07 LAB — CEA: CEA: 105.4 ng/mL — ABNORMAL HIGH (ref 0.0–4.7)

## 2015-05-13 ENCOUNTER — Inpatient Hospital Stay (HOSPITAL_BASED_OUTPATIENT_CLINIC_OR_DEPARTMENT_OTHER): Payer: Medicare Other | Admitting: Oncology

## 2015-05-13 ENCOUNTER — Inpatient Hospital Stay: Payer: Medicare Other

## 2015-05-13 ENCOUNTER — Ambulatory Visit: Payer: Medicare Other

## 2015-05-13 VITALS — BP 122/75 | HR 79 | Temp 96.9°F | Resp 20 | Wt 143.7 lb

## 2015-05-13 DIAGNOSIS — C189 Malignant neoplasm of colon, unspecified: Secondary | ICD-10-CM | POA: Diagnosis not present

## 2015-05-13 DIAGNOSIS — C7800 Secondary malignant neoplasm of unspecified lung: Secondary | ICD-10-CM | POA: Diagnosis not present

## 2015-05-13 DIAGNOSIS — R748 Abnormal levels of other serum enzymes: Secondary | ICD-10-CM | POA: Diagnosis not present

## 2015-05-13 DIAGNOSIS — C787 Secondary malignant neoplasm of liver and intrahepatic bile duct: Secondary | ICD-10-CM

## 2015-05-13 DIAGNOSIS — R5381 Other malaise: Secondary | ICD-10-CM

## 2015-05-13 DIAGNOSIS — R531 Weakness: Secondary | ICD-10-CM

## 2015-05-13 DIAGNOSIS — R630 Anorexia: Secondary | ICD-10-CM

## 2015-05-13 DIAGNOSIS — R140 Abdominal distension (gaseous): Secondary | ICD-10-CM

## 2015-05-13 DIAGNOSIS — R5383 Other fatigue: Secondary | ICD-10-CM

## 2015-05-13 DIAGNOSIS — G629 Polyneuropathy, unspecified: Secondary | ICD-10-CM

## 2015-05-13 DIAGNOSIS — R109 Unspecified abdominal pain: Secondary | ICD-10-CM

## 2015-05-13 DIAGNOSIS — R110 Nausea: Secondary | ICD-10-CM

## 2015-05-13 LAB — COMPREHENSIVE METABOLIC PANEL
ALK PHOS: 735 U/L — AB (ref 38–126)
ALT: 153 U/L — ABNORMAL HIGH (ref 14–54)
AST: 188 U/L — ABNORMAL HIGH (ref 15–41)
Albumin: 3.9 g/dL (ref 3.5–5.0)
Anion gap: 9 (ref 5–15)
BILIRUBIN TOTAL: 2.3 mg/dL — AB (ref 0.3–1.2)
BUN: 17 mg/dL (ref 6–20)
CO2: 28 mmol/L (ref 22–32)
Calcium: 9.8 mg/dL (ref 8.9–10.3)
Chloride: 100 mmol/L — ABNORMAL LOW (ref 101–111)
Creatinine, Ser: 1.11 mg/dL — ABNORMAL HIGH (ref 0.44–1.00)
GFR, EST AFRICAN AMERICAN: 56 mL/min — AB (ref >60)
GFR, EST NON AFRICAN AMERICAN: 48 mL/min — AB (ref >60)
Glucose, Bld: 110 mg/dL — ABNORMAL HIGH (ref 65–99)
Potassium: 3.4 mmol/L — ABNORMAL LOW (ref 3.5–5.1)
SODIUM: 137 mmol/L (ref 135–145)
Total Protein: 7.8 g/dL (ref 6.5–8.1)

## 2015-05-13 NOTE — Progress Notes (Signed)
[place]  Telephone:([phone] Fax:(336) [phone]  ID: Christina Walker OB: 1941/09/04  MR#: [id number]  XHB#:[id number]  Patient Care Team: Tracie Harrier, MD as PCP - General (Internal Medicine)  CHIEF COMPLAINT:  Chief Complaint  Patient presents with  . Follow-up    colon cancer    INTERVAL HISTORY: Patient returns to clinic today for consideration of cycle 7 of FOLFIRI plus Avastin. She continues to have peripheral neuropathy. She has increased abdominal pain and bloating. That she states is worse with eating. Her appetite has decreased. She has occasional nausea. She continues to have chronic back pain. She has no other neurologic complaints. She denies any recent fevers. She has no chest pain or shortness of breath. She denies any vomiting, constipation, or diarrhea. She has no melena or hematochezia. She has no urinary complaints. Patient offers no further specific complaints today.  REVIEW OF SYSTEMS:   Review of Systems  Constitutional: Positive for malaise/fatigue.  Respiratory: Positive for shortness of breath.   Gastrointestinal: Positive for nausea and abdominal pain. Negative for vomiting and diarrhea.  Neurological: Positive for sensory change and weakness.    As per HPI. Otherwise, a complete review of systems is negatve.  PAST MEDICAL HISTORY: Past Medical History  Diagnosis Date  . Colon cancer   . Back pain   . Anemia   . Hypertension     PAST SURGICAL HISTORY: Past Surgical History  Procedure Laterality Date  . Cholecystectomy    . Abdominal hysterectomy      FAMILY HISTORY: Father with prostate cancer.     ADVANCED DIRECTIVES:    HEALTH MAINTENANCE: History  Substance Use Topics  . Smoking status: Never Smoker   . Smokeless tobacco: Not on file  . Alcohol Use: Not on file     Colonoscopy:  PAP:  Bone density:  Lipid panel:  Allergies  Allergen Reactions  . No Known Allergies     Current Outpatient  Prescriptions  Medication Sig Dispense Refill  . acetaminophen (TYLENOL) 325 MG tablet Take 650 mg by mouth every 4 (four) hours as needed.    . cyanocobalamin 500 MCG tablet Take 500 mcg by mouth daily.    . diazepam (VALIUM) 5 MG tablet Take 5 mg by mouth daily as needed for anxiety (at bedtime).    . ondansetron (ZOFRAN-ODT) 4 MG disintegrating tablet Take 4 mg by mouth every 8 (eight) hours as needed for nausea or vomiting.    . potassium chloride (K-DUR,KLOR-CON) 10 MEQ tablet Take 10 mEq by mouth daily.    . simvastatin (ZOCOR) 20 MG tablet Take 20 mg by mouth daily.    Marland Kitchen triamterene-hydrochlorothiazide (MAXZIDE-25) 37.5-25 MG per tablet Take 1 tablet by mouth daily.     No current facility-administered medications for this visit.   Facility-Administered Medications Ordered in Other Visits  Medication Dose Route Frequency Provider Last Rate Last Dose  . sodium chloride 0.9 % injection 10 mL  10 mL Intracatheter PRN Lloyd Huger, MD        OBJECTIVE: Filed Vitals:   05/06/15 0957  BP: 134/78  Pulse: 60  Temp: 95.7 F (35.4 C)  Resp: 18     Body mass index is 26.75 kg/(m^2).    ECOG FS:1 - Symptomatic but completely ambulatory  General: Well-developed, well-nourished, no acute distress. Eyes: anicteric sclera. Lungs: Clear to auscultation bilaterally. Heart: Regular rate and rhythm. No rubs, murmurs, or gallops. Abdomen: Soft, mild tenderness, normoactive bowel sounds. Musculoskeletal: No edema, cyanosis, or  clubbing. Neuro: Alert, answering all questions appropriately. Cranial nerves grossly intact. Skin: No rashes or petechiae noted. Psych: Normal affect.   LAB RESULTS:  Lab Results  Component Value Date   NA 137 05/13/2015   K 3.4* 05/13/2015   CL 100* 05/13/2015   CO2 28 05/13/2015   GLUCOSE 110* 05/13/2015   BUN 17 05/13/2015   CREATININE 1.11* 05/13/2015   CALCIUM 9.8 05/13/2015   PROT 7.8 05/13/2015   ALBUMIN 3.9 05/13/2015   AST 188* 05/13/2015    ALT 153* 05/13/2015   ALKPHOS 735* 05/13/2015   BILITOT 2.3* 05/13/2015   GFRNONAA 48* 05/13/2015   GFRAA 56* 05/13/2015    Lab Results  Component Value Date   WBC 4.5 05/06/2015   NEUTROABS 2.7 05/06/2015   HGB 11.9* 05/06/2015   HCT 37.9 05/06/2015   MCV 82.2 05/06/2015   PLT 207 05/06/2015     STUDIES: US Abdomen Complete  05/07/2015   CLINICAL DATA:  Generalized abdominal pain for the past 3 weeks, elevated liver enzymes, previous cholecystectomy  EXAM: ULTRASOUND ABDOMEN COMPLETE  COMPARISON:  Abdominal and pelvic CT scan dated November 12, 2013  FINDINGS: Gallbladder: The gallbladder is surgically absent.  Common bile duct: Diameter: 5 mm  Liver: The hepatic echotexture is normal. There is intrahepatic ductal dilation. In 2014 there were hypodense hepatic foci demonstrated which are not evident today.  IVC: No abnormality visualized.  Pancreas: Bowel gas obscures portions of the pancreatic head and tail.  Spleen: Size and appearance within normal limits.  Right Kidney: Length: 10 cm. There is a midpole cyst measuring 7 mm in greatest dimension. There is no hydronephrosis.  Left Kidney: Length: 8.9 cm. Echogenicity within normal limits. No mass or hydronephrosis visualized.  Abdominal aorta: No aneurysm visualized.  Other findings: No ascites is observed.  IMPRESSION: 1. Intrahepatic ductal dilation of uncertain etiology. The common bile duct is not significantly distended. No focal hepatic masses observed on today's study. Evaluation of the pancreas is limited. Further evaluation with abdominal MRI or CT scan is recommended. 2. There is no acute abnormality demonstrated elsewhere.   Electronically Signed   By: David  Martinique M.D.   On: 05/07/2015 09:10   Dg Abd 2 Views  05/07/2015   CLINICAL DATA:  Abdominal pain and bloating. History of colon carcinoma. Elevated liver function tests.  EXAM: ABDOMEN - 2 VIEW  COMPARISON:  CT of the abdomen and pelvis on 11/12/2013  FINDINGS: Clips are seen  related to prior cholecystectomy. Bowel gas pattern shows no evidence of obstruction or ileus. No free air is identified. No soft tissue abnormalities or abnormal calcifications. Advanced degenerative changes of the lower lumbar spine present with associated mild leftward convex scoliosis. No focal bony lesions are identified.  IMPRESSION: Unremarkable bowel gas pattern.   Electronically Signed   By: Aletta Edouard M.D.   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  PLAN:    1. Colon cancer: CT scan results reviewed independently with improvement of disease burden. Delay cycle 7 of palliative chemotherapy with FOLFIRI plus Avastin secondary to abdominal pain and bloating as well as increasing LFTs. Previously, irinotecan was dose reduced secondary to neuropathy. Patient has requested less frequent treatments and now will receive her infusions every 3 weeks.  Return to clinic in 1 week for reconsideration of cycle 7.  2. Appetite: Continue Megace as needed. 3. Diarrhea: Patient does not complain of this today. Continue OTC Imodium  as needed. 4. Pancytopenia: Resolved. 5. Peripheral neuropathy:  Patient does not wish to try gabapentin at this time, but has agreed if her neuropathy becomes painful. Monitor.  6. Weakness and fatigue: Mild. Secondary to chemotherapy, monitor. 7. Back pain: Chronic. 8. Rising LFTs: Will get abdominal x-ray as well as ultrasound. 9. Abdominal pain: Amylase and lipase are normal, imaging as above.  Patient expressed understanding and was in agreement with this plan. She also understands that She can call clinic at any time with any questions, concerns, or complaints.   No matching staging information was found for the patient.  Lloyd Huger, MD   05/13/2015 1:30 PM

## 2015-05-13 NOTE — Progress Notes (Signed)
[place]  Telephone:(336) [phone] Fax:(336) [phone]  ID: Christina Walker OB: 1941/05/03  MR#: [id number]  EPP#:[id number]  Patient Care Team: Tracie Harrier, MD as PCP - General (Internal Medicine)  CHIEF COMPLAINT:  Chief Complaint  Patient presents with  . Follow-up    colon cancer    INTERVAL HISTORY: Patient returns to clinic today for reconsideration of cycle 7 of FOLFIRI plus Avastin. She continues to have peripheral neuropathy. She continues to have abdominal pain and bloating. She also has a poor appetite and nausea. She continues to have chronic back pain. She has no other neurologic complaints. She denies any recent fevers. She has no chest pain or shortness of breath. She denies any vomiting, constipation, or diarrhea. She has no melena or hematochezia. She has no urinary complaints. Patient offers no further specific complaints today.  REVIEW OF SYSTEMS:   Review of Systems  Constitutional: Positive for malaise/fatigue.  Respiratory: Positive for shortness of breath.   Gastrointestinal: Positive for nausea and abdominal pain. Negative for vomiting and diarrhea.  Neurological: Positive for sensory change and weakness.    As per HPI. Otherwise, a complete review of systems is negatve.  PAST MEDICAL HISTORY: Past Medical History  Diagnosis Date  . Colon cancer   . Back pain   . Anemia   . Hypertension     PAST SURGICAL HISTORY: Past Surgical History  Procedure Laterality Date  . Cholecystectomy    . Abdominal hysterectomy      FAMILY HISTORY: Father with prostate cancer.     ADVANCED DIRECTIVES:    HEALTH MAINTENANCE: History  Substance Use Topics  . Smoking status: Never Smoker   . Smokeless tobacco: Not on file  . Alcohol Use: Not on file     Colonoscopy:  PAP:  Bone density:  Lipid panel:  Allergies  Allergen Reactions  . No Known Allergies     Current Outpatient Prescriptions  Medication Sig Dispense Refill    . acetaminophen (TYLENOL) 325 MG tablet Take 650 mg by mouth every 4 (four) hours as needed.    . cyanocobalamin 500 MCG tablet Take 500 mcg by mouth daily.    . diazepam (VALIUM) 5 MG tablet Take 5 mg by mouth daily as needed for anxiety (at bedtime).    . ondansetron (ZOFRAN-ODT) 4 MG disintegrating tablet Take 4 mg by mouth every 8 (eight) hours as needed for nausea or vomiting.    . potassium chloride (K-DUR,KLOR-CON) 10 MEQ tablet Take 10 mEq by mouth daily.    . simvastatin (ZOCOR) 20 MG tablet Take 20 mg by mouth daily.    Marland Kitchen triamterene-hydrochlorothiazide (MAXZIDE-25) 37.5-25 MG per tablet Take 1 tablet by mouth daily.     No current facility-administered medications for this visit.   Facility-Administered Medications Ordered in Other Visits  Medication Dose Route Frequency Provider Last Rate Last Dose  . sodium chloride 0.9 % injection 10 mL  10 mL Intracatheter PRN Lloyd Huger, MD        OBJECTIVE: Filed Vitals:   05/13/15 0929  BP: 122/75  Pulse: 79  Temp: 96.9 F (36.1 C)  Resp: 20     Body mass index is 26.79 kg/(m^2).    ECOG FS:1 - Symptomatic but completely ambulatory  General: Well-developed, well-nourished, no acute distress. Eyes: anicteric sclera. Lungs: Clear to auscultation bilaterally. Heart: Regular rate and rhythm. No rubs, murmurs, or gallops. Abdomen: Soft, mild tenderness, normoactive bowel sounds. Musculoskeletal: No edema, cyanosis, or clubbing. Neuro: Alert, answering all  questions appropriately. Cranial nerves grossly intact. Skin: No rashes or petechiae noted. Psych: Normal affect.   LAB RESULTS:  Lab Results  Component Value Date   NA 137 05/13/2015   K 3.4* 05/13/2015   CL 100* 05/13/2015   CO2 28 05/13/2015   GLUCOSE 110* 05/13/2015   BUN 17 05/13/2015   CREATININE 1.11* 05/13/2015   CALCIUM 9.8 05/13/2015   PROT 7.8 05/13/2015   ALBUMIN 3.9 05/13/2015   AST 188* 05/13/2015   ALT 153* 05/13/2015   ALKPHOS 735* 05/13/2015    BILITOT 2.3* 05/13/2015   GFRNONAA 48* 05/13/2015   GFRAA 56* 05/13/2015    Lab Results  Component Value Date   WBC 4.5 05/06/2015   NEUTROABS 2.7 05/06/2015   HGB 11.9* 05/06/2015   HCT 37.9 05/06/2015   MCV 82.2 05/06/2015   PLT 207 05/06/2015     STUDIES: US Abdomen Complete  05/07/2015   CLINICAL DATA:  Generalized abdominal pain for the past 3 weeks, elevated liver enzymes, previous cholecystectomy  EXAM: ULTRASOUND ABDOMEN COMPLETE  COMPARISON:  Abdominal and pelvic CT scan dated November 12, 2013  FINDINGS: Gallbladder: The gallbladder is surgically absent.  Common bile duct: Diameter: 5 mm  Liver: The hepatic echotexture is normal. There is intrahepatic ductal dilation. In 2014 there were hypodense hepatic foci demonstrated which are not evident today.  IVC: No abnormality visualized.  Pancreas: Bowel gas obscures portions of the pancreatic head and tail.  Spleen: Size and appearance within normal limits.  Right Kidney: Length: 10 cm. There is a midpole cyst measuring 7 mm in greatest dimension. There is no hydronephrosis.  Left Kidney: Length: 8.9 cm. Echogenicity within normal limits. No mass or hydronephrosis visualized.  Abdominal aorta: No aneurysm visualized.  Other findings: No ascites is observed.  IMPRESSION: 1. Intrahepatic ductal dilation of uncertain etiology. The common bile duct is not significantly distended. No focal hepatic masses observed on today's study. Evaluation of the pancreas is limited. Further evaluation with abdominal MRI or CT scan is recommended. 2. There is no acute abnormality demonstrated elsewhere.   Electronically Signed   By: David  Martinique M.D.   On: 05/07/2015 09:10   Dg Abd 2 Views  05/07/2015   CLINICAL DATA:  Abdominal pain and bloating. History of colon carcinoma. Elevated liver function tests.  EXAM: ABDOMEN - 2 VIEW  COMPARISON:  CT of the abdomen and pelvis on 11/12/2013  FINDINGS: Clips are seen related to prior cholecystectomy. Bowel gas  pattern shows no evidence of obstruction or ileus. No free air is identified. No soft tissue abnormalities or abnormal calcifications. Advanced degenerative changes of the lower lumbar spine present with associated mild leftward convex scoliosis. No focal bony lesions are identified.  IMPRESSION: Unremarkable bowel gas pattern.   Electronically Signed   By: Aletta Edouard M.D.   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  PLAN:    1. Colon cancer: CT scan results reviewed independently with improvement of disease burden. Delay cycle 7 of palliative chemotherapy with FOLFIRI plus Avastin secondary to abdominal pain and bloating as well as increasing LFTs. Will reimage with CT scan in the next week to assess for progression of disease. Patient's CEA has slowly increased and is now 105.  Previously, irinotecan was dose reduced secondary to neuropathy. Patient has requested less frequent treatments and now will receive her infusions every 3 weeks.  Return to clinic in 2 weeks for further evaluation, discussion  of her imaging results and consideration of cycle 7.  2. Appetite: Continue Megace as needed. 3. Diarrhea: Patient does not complain of this today. Continue OTC Imodium as needed. 4. Pancytopenia: Resolved. 5. Peripheral neuropathy:  Patient does not wish to try gabapentin at this time, but has agreed if her neuropathy becomes painful. Monitor.  6. Weakness and fatigue: Mild. Secondary to chemotherapy, monitor. 7. Back pain: Chronic. 8. Rising LFTs: Abdominal x-ray and ultrasound are negative. AST, ALT, bilirubin slowly trending up. CT scan as above. 9. Abdominal pain: Amylase and lipase are normal, imaging as above.  Patient expressed understanding and was in agreement with this plan. She also understands that She can call clinic at any time with any questions, concerns, or complaints.   No matching staging information was  found for the patient.  Lloyd Huger, MD   05/13/2015 1:42 PM

## 2015-05-20 ENCOUNTER — Ambulatory Visit
Admission: RE | Admit: 2015-05-20 | Discharge: 2015-05-20 | Disposition: A | Discharge status: Home / Self Care | Payer: Medicare Other | Source: Ambulatory Visit | Attending: Oncology | Admitting: Oncology

## 2015-05-20 DIAGNOSIS — C787 Secondary malignant neoplasm of liver and intrahepatic bile duct: Secondary | ICD-10-CM | POA: Diagnosis present

## 2015-05-20 DIAGNOSIS — C189 Malignant neoplasm of colon, unspecified: Secondary | ICD-10-CM | POA: Diagnosis present

## 2015-05-20 MED ORDER — IOHEXOL 240 MG/ML SOLN: 50.0000 mL | Freq: Once | INTRAMUSCULAR | Status: DC | PRN

## 2015-05-20 MED ORDER — IOHEXOL 300 MG/ML  SOLN
100.0000 mL | Freq: Once | INTRAMUSCULAR | Status: AC | PRN
  Administered 2015-05-20: 100 mL via INTRAVENOUS

## 2015-05-26 ENCOUNTER — Other Ambulatory Visit: Payer: Self-pay | Admitting: *Deleted

## 2015-05-26 DIAGNOSIS — C189 Malignant neoplasm of colon, unspecified: Secondary | ICD-10-CM

## 2015-05-27 ENCOUNTER — Inpatient Hospital Stay: Payer: Medicare Other

## 2015-05-27 ENCOUNTER — Other Ambulatory Visit: Payer: Medicare Other

## 2015-05-27 ENCOUNTER — Ambulatory Visit: Payer: Medicare Other | Admitting: Oncology

## 2015-05-27 ENCOUNTER — Inpatient Hospital Stay (HOSPITAL_BASED_OUTPATIENT_CLINIC_OR_DEPARTMENT_OTHER): Payer: Medicare Other | Admitting: Oncology

## 2015-05-27 VITALS — BP 131/84 | HR 76 | Temp 97.3°F | Resp 18 | Wt 139.6 lb

## 2015-05-27 DIAGNOSIS — R630 Anorexia: Secondary | ICD-10-CM

## 2015-05-27 DIAGNOSIS — G629 Polyneuropathy, unspecified: Secondary | ICD-10-CM

## 2015-05-27 DIAGNOSIS — K838 Other specified diseases of biliary tract: Secondary | ICD-10-CM | POA: Diagnosis not present

## 2015-05-27 DIAGNOSIS — R110 Nausea: Secondary | ICD-10-CM

## 2015-05-27 DIAGNOSIS — R5383 Other fatigue: Secondary | ICD-10-CM

## 2015-05-27 DIAGNOSIS — C787 Secondary malignant neoplasm of liver and intrahepatic bile duct: Secondary | ICD-10-CM

## 2015-05-27 DIAGNOSIS — R109 Unspecified abdominal pain: Secondary | ICD-10-CM

## 2015-05-27 DIAGNOSIS — R748 Abnormal levels of other serum enzymes: Secondary | ICD-10-CM

## 2015-05-27 DIAGNOSIS — R531 Weakness: Secondary | ICD-10-CM

## 2015-05-27 DIAGNOSIS — C7800 Secondary malignant neoplasm of unspecified lung: Secondary | ICD-10-CM | POA: Diagnosis not present

## 2015-05-27 DIAGNOSIS — C189 Malignant neoplasm of colon, unspecified: Secondary | ICD-10-CM | POA: Diagnosis not present

## 2015-05-27 DIAGNOSIS — R140 Abdominal distension (gaseous): Secondary | ICD-10-CM

## 2015-05-27 LAB — CBC WITH DIFFERENTIAL/PLATELET
Basophils Absolute: 0 10*3/uL (ref 0–0.1)
Basophils Relative: 1 %
Eosinophils Absolute: 0.1 10*3/uL (ref 0–0.7)
Eosinophils Relative: 2 %
HCT: 39.1 % (ref 35.0–47.0)
HEMOGLOBIN: 12.4 g/dL (ref 12.0–16.0)
Lymphocytes Relative: 17 %
Lymphs Abs: 0.9 10*3/uL — ABNORMAL LOW (ref 1.0–3.6)
MCH: 26.4 pg (ref 26.0–34.0)
MCHC: 31.7 g/dL — AB (ref 32.0–36.0)
MCV: 83.4 fL (ref 80.0–100.0)
MONOS PCT: 7 %
Monocytes Absolute: 0.4 10*3/uL (ref 0.2–0.9)
NEUTROS ABS: 4 10*3/uL (ref 1.4–6.5)
Neutrophils Relative %: 73 %
PLATELETS: 155 10*3/uL (ref 150–440)
RBC: 4.69 MIL/uL (ref 3.80–5.20)
RDW: 16.4 % — ABNORMAL HIGH (ref 11.5–14.5)
WBC: 5.4 10*3/uL (ref 3.6–11.0)

## 2015-05-27 LAB — COMPREHENSIVE METABOLIC PANEL
ALT: 184 U/L — AB (ref 14–54)
AST: 253 U/L — ABNORMAL HIGH (ref 15–41)
Albumin: 3.4 g/dL — ABNORMAL LOW (ref 3.5–5.0)
Alkaline Phosphatase: 749 U/L — ABNORMAL HIGH (ref 38–126)
Anion gap: 8 (ref 5–15)
BUN: 12 mg/dL (ref 6–20)
CHLORIDE: 99 mmol/L — AB (ref 101–111)
CO2: 25 mmol/L (ref 22–32)
CREATININE: 0.97 mg/dL (ref 0.44–1.00)
Calcium: 8.9 mg/dL (ref 8.9–10.3)
GFR, EST NON AFRICAN AMERICAN: 57 mL/min — AB (ref >60)
Glucose, Bld: 157 mg/dL — ABNORMAL HIGH (ref 65–99)
Potassium: 2.5 mmol/L — CL (ref 3.5–5.1)
Sodium: 132 mmol/L — ABNORMAL LOW (ref 135–145)
Total Bilirubin: 11.2 mg/dL — ABNORMAL HIGH (ref 0.3–1.2)
Total Protein: 7.8 g/dL (ref 6.5–8.1)

## 2015-05-27 MED ORDER — SODIUM CHLORIDE 0.9 % IJ SOLN
10.0000 mL | INTRAMUSCULAR | Status: DC | PRN
  Administered 2015-05-27: 10 mL via INTRAVENOUS
  Filled 2015-05-27: qty 10

## 2015-05-27 MED ORDER — HEPARIN SOD (PORK) LOCK FLUSH 100 UNIT/ML IV SOLN
500.0000 [IU] | Freq: Once | INTRAVENOUS | Status: AC
  Administered 2015-05-27: 500 [IU] via INTRAVENOUS
  Filled 2015-05-27: qty 5

## 2015-05-27 NOTE — Progress Notes (Signed)
Patient still has abdominal pain/soreness after eating and is only able to eat small amounts before the discomfort will start.

## 2015-05-30 MED ORDER — TRIFLURIDINE-TIPIRACIL 20-8.19 MG PO TABS: 35.0000 mg/m2 | ORAL_TABLET | Freq: Two times a day (BID) | ORAL | Status: AC

## 2015-06-02 NOTE — Progress Notes (Signed)
[place]  Telephone:(336) [phone] Fax:(336) [phone]  ID: Christina Walker OB: 1941-10-27  MR#: [id number]  KWI#:[id number]  Patient Care Team: Tracie Harrier, MD as PCP - General (Internal Medicine)  CHIEF COMPLAINT:  Chief Complaint  Patient presents with  . Follow-up    colon cancer    INTERVAL HISTORY: Patient returns to clinic today for discussion of her imaging results and treatment planning. She continues to have peripheral neuropathy. She continues to have abdominal pain and bloating. She also has a poor appetite and nausea. She continues to have chronic back pain. She has no other neurologic complaints. She denies any recent fevers. She has no chest pain or shortness of breath. She denies any vomiting, constipation, or diarrhea. She has no melena or hematochezia. She has no urinary complaints. Patient offers no further specific complaints today.  REVIEW OF SYSTEMS:   Review of Systems  Constitutional: Positive for malaise/fatigue.  Respiratory: Positive for shortness of breath.   Gastrointestinal: Positive for nausea and abdominal pain. Negative for vomiting and diarrhea.  Neurological: Positive for sensory change and weakness.    As per HPI. Otherwise, a complete review of systems is negatve.  PAST MEDICAL HISTORY: Past Medical History  Diagnosis Date  . Colon cancer   . Back pain   . Anemia   . Hypertension     PAST SURGICAL HISTORY: Past Surgical History  Procedure Laterality Date  . Cholecystectomy    . Abdominal hysterectomy      FAMILY HISTORY: Father with prostate cancer.     ADVANCED DIRECTIVES:    HEALTH MAINTENANCE: History  Substance Use Topics  . Smoking status: Never Smoker   . Smokeless tobacco: Not on file  . Alcohol Use: Not on file     Colonoscopy:  PAP:  Bone density:  Lipid panel:  Allergies  Allergen Reactions  . No Known Allergies     Current Outpatient Prescriptions  Medication Sig Dispense  Refill  . acetaminophen (TYLENOL) 325 MG tablet Take 650 mg by mouth every 4 (four) hours as needed.    . cyanocobalamin 500 MCG tablet Take 500 mcg by mouth daily.    . diazepam (VALIUM) 5 MG tablet Take 5 mg by mouth daily as needed for anxiety (at bedtime).    . ondansetron (ZOFRAN-ODT) 4 MG disintegrating tablet Take 4 mg by mouth every 8 (eight) hours as needed for nausea or vomiting.    . potassium chloride (K-DUR,KLOR-CON) 10 MEQ tablet Take 10 mEq by mouth daily.    . simvastatin (ZOCOR) 20 MG tablet Take 20 mg by mouth daily.    Marland Kitchen triamterene-hydrochlorothiazide (MAXZIDE-25) 37.5-25 MG per tablet Take 1 tablet by mouth daily.    Marland Kitchen trifluridine-tipiracil (LONSURF) 20-8.19 MG tablet Take 3 tablets (60 mg of trifluridine total) by mouth 2 (two) times daily after a meal. 1 hr after AM & PM meals on days 1-5, 8-12. Repeat every 28day 60 tablet 2   No current facility-administered medications for this visit.   Facility-Administered Medications Ordered in Other Visits  Medication Dose Route Frequency Provider Last Rate Last Dose  . sodium chloride 0.9 % injection 10 mL  10 mL Intracatheter PRN Lloyd Huger, MD        OBJECTIVE: Filed Vitals:   05/27/15 0941  BP: 131/84  Pulse: 76  Temp: 97.3 F (36.3 C)  Resp: 18     Body mass index is 26.01 kg/(m^2).    ECOG FS:1 - Symptomatic but completely ambulatory  General:  Well-developed, well-nourished, no acute distress. Eyes: anicteric sclera. Lungs: Clear to auscultation bilaterally. Heart: Regular rate and rhythm. No rubs, murmurs, or gallops. Abdomen: Soft, mild tenderness, normoactive bowel sounds. Musculoskeletal: No edema, cyanosis, or clubbing. Neuro: Alert, answering all questions appropriately. Cranial nerves grossly intact. Skin: No rashes or petechiae noted. Psych: Normal affect.   LAB RESULTS:  Lab Results  Component Value Date   NA 132* 05/27/2015   K 2.5* 05/27/2015   CL 99* 05/27/2015   CO2 25 05/27/2015     GLUCOSE 157* 05/27/2015   BUN 12 05/27/2015   CREATININE 0.97 05/27/2015   CALCIUM 8.9 05/27/2015   PROT 7.8 05/27/2015   ALBUMIN 3.4* 05/27/2015   AST 253* 05/27/2015   ALT 184* 05/27/2015   ALKPHOS 749* 05/27/2015   BILITOT 11.2* 05/27/2015   GFRNONAA 57* 05/27/2015   GFRAA >60 05/27/2015    Lab Results  Component Value Date   WBC 5.4 05/27/2015   NEUTROABS 4.0 05/27/2015   HGB 12.4 05/27/2015   HCT 39.1 05/27/2015   MCV 83.4 05/27/2015   PLT 155 05/27/2015     STUDIES: US Abdomen Complete  05/07/2015   CLINICAL DATA:  Generalized abdominal pain for the past 3 weeks, elevated liver enzymes, previous cholecystectomy  EXAM: ULTRASOUND ABDOMEN COMPLETE  COMPARISON:  Abdominal and pelvic CT scan dated November 12, 2013  FINDINGS: Gallbladder: The gallbladder is surgically absent.  Common bile duct: Diameter: 5 mm  Liver: The hepatic echotexture is normal. There is intrahepatic ductal dilation. In 2014 there were hypodense hepatic foci demonstrated which are not evident today.  IVC: No abnormality visualized.  Pancreas: Bowel gas obscures portions of the pancreatic head and tail.  Spleen: Size and appearance within normal limits.  Right Kidney: Length: 10 cm. There is a midpole cyst measuring 7 mm in greatest dimension. There is no hydronephrosis.  Left Kidney: Length: 8.9 cm. Echogenicity within normal limits. No mass or hydronephrosis visualized.  Abdominal aorta: No aneurysm visualized.  Other findings: No ascites is observed.  IMPRESSION: 1. Intrahepatic ductal dilation of uncertain etiology. The common bile duct is not significantly distended. No focal hepatic masses observed on today's study. Evaluation of the pancreas is limited. Further evaluation with abdominal MRI or CT scan is recommended. 2. There is no acute abnormality demonstrated elsewhere.   Electronically Signed   By: David  Martinique M.D.   On: 05/07/2015 09:10   Ct Abdomen Pelvis W Contrast  05/20/2015   CLINICAL DATA:   Subsequent treatment strategy for colorectal carcinoma. Last chemotherapy May 2016. Initial cancer diagnosed 2014 with partial colectomy.  EXAM: CT ABDOMEN AND PELVIS WITH CONTRAST  TECHNIQUE: Multidetector CT imaging of the abdomen and pelvis was performed using the standard protocol following bolus administration of intravenous contrast.  CONTRAST:  177mL OMNIPAQUE IOHEXOL 300 MG/ML  SOLN  COMPARISON:  CT 11/12/2013, 03/12/2015  FINDINGS: Lower chest: Two adjacent nodules in the inferior anterior right lower lobe on image 1 , series 5 measure 8 mm each. These similar to comparison exam and have a tubular pattern suggesting central mucous plugging.  Hepatobiliary: Continued biliary duct dilatation within the left hepatic lobe which is not improved. There is new biliary duct dilatation in the right hepatic lobe. This suggests collecting lesion at the confluence of bile ducts. This central obstructing lesion is difficult define. The low-density lesion in the left hepatic lobe on image 17, series 2 measuring 5 mm not clearly seen on prior. Low-density lesions the confluence of the right hepatic vein  and IVC measures 7 mm similar to a 9 mm (image 13, series 2.)  Post cholecystectomy.  The distal common bile duct is not dilated.  Pancreas: Pancreas is normal. No ductal dilatation. No pancreatic inflammation.  Spleen: Normal spleen  Adrenals/urinary tract: Adrenal glands and kidneys are normal. The ureters and bladder normal.  Stomach/Bowel: Stomach, small bowel, appendix, and cecum are normal. There is anastomosis at the level of the sigmoid colon. No obstruction or nodularity. Rectum is normal.  Vascular/Lymphatic: Abdominal aorta is normal caliber. There is no retroperitoneal or periportal lymphadenopathy. No pelvic lymphadenopathy.  Reproductive: Post hysterectomy.  Musculoskeletal: No aggressive osseous lesion.  Other: No ascites  IMPRESSION: 1. New right hepatic lobe biliary duct dilatation and persistent left  hepatic lobe biliary duct dilatation suggests progression of a centrally obstructing lesion. Lesion not well-defined by CT. Consider MRI or potentially FDG PET scan for localization if clinically relevant. 2. New lesion in the central left hepatic lobe consistent with parenchymal hepatic metastasis progression. 3. Stable pulmonary nodularity in the right lower lobe.   Electronically Signed   By: Suzy Bouchard M.D.   On: 05/20/2015 09:59   Dg Abd 2 Views  05/07/2015   CLINICAL DATA:  Abdominal pain and bloating. History of colon carcinoma. Elevated liver function tests.  EXAM: ABDOMEN - 2 VIEW  COMPARISON:  CT of the abdomen and pelvis on 11/12/2013  FINDINGS: Clips are seen related to prior cholecystectomy. Bowel gas pattern shows no evidence of obstruction or ileus. No free air is identified. No soft tissue abnormalities or abnormal calcifications. Advanced degenerative changes of the lower lumbar spine present with associated mild leftward convex scoliosis. No focal bony lesions are identified.  IMPRESSION: Unremarkable bowel gas pattern.   Electronically Signed   By: Aletta Edouard M.D.   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  PLAN:    1. Colon cancer: CT scan results reviewed independently with progression of disease. Will discontinue her chemotherapy at this time. After lengthy discussion with the patient and her family, she has declined hospice treatment and wishes to continue aggressive chemotherapy. Will initiate her on oral Lonsurf 35mg /m2. She will take this on days 1 through 5 and then again on days 8 through 12 with a 2 week break. Patient will return to clinic in approximately 4 weeks after conclusion of cycle 1 for repeat laboratory work and further evaluation. Plan to reimage in September 2016. Patient's CEA has slowly increased and is now 105.    2. Appetite: Continue Megace as needed. 3. Diarrhea: Patient does not  complain of this today. Continue OTC Imodium as needed. 4. Pancytopenia: Resolved. 5. Peripheral neuropathy:  Patient does not wish to try gabapentin at this time, but has agreed if her neuropathy becomes painful. Monitor.  6. Weakness and fatigue: Mild. Secondary to chemotherapy, monitor. 7. Back pain: Chronic. 8. Rising LFTs: Secondary to progression of disease.   Patient expressed understanding and was in agreement with this plan. She also understands that She can call clinic at any time with any questions, concerns, or complaints.   No matching staging information was found for the patient.  Lloyd Huger, MD   06/02/2015 3:11 PM

## 2015-06-03 ENCOUNTER — Telehealth: Payer: Self-pay | Admitting: *Deleted

## 2015-06-03 NOTE — Telephone Encounter (Signed)
Informed that the itching is most likely from her increased bilirubin and med will not help. Her treament should start next week and that should open the blockage causing the bilirubin to be elevated. She reported that she does not have an appt. I told her I would check on this for her

## 2015-06-03 NOTE — Telephone Encounter (Signed)
Informed that she is to call once her med is delivered to her home adn we will schedule an appt then for 1 week after she starts her med. I told her the drug company will contact her to confirm her address before they ship it and to be sure to speak with them

## 2015-06-10 ENCOUNTER — Telehealth: Payer: Self-pay | Admitting: *Deleted

## 2015-06-10 NOTE — Telephone Encounter (Signed)
Called pt after speaking to Dr Grayland Ormond and advised that she needs to be taking potassium 20 meq twice a day she repeated this to me. She then asked about the chemo pill and a fu appt

## 2015-06-10 NOTE — Telephone Encounter (Signed)
Informed patient rx for Frankey Poot was sent to [facility] and received letter from insurance today that it has been approved.  Patient mentions that she is not sure if she wants to take this medication so I asked her to keep [place] informed about her decision.

## 2015-06-17 ENCOUNTER — Telehealth: Payer: Self-pay | Admitting: *Deleted

## 2015-06-17 NOTE — Telephone Encounter (Signed)
Will enter orders for follow up to be scheduled.

## 2015-06-17 NOTE — Telephone Encounter (Signed)
Med arrived today and she is eating lunch then will take her first dose of 3 pills. She has no fu appt scheduled

## 2015-07-11 ENCOUNTER — Other Ambulatory Visit: Payer: Self-pay | Admitting: *Deleted

## 2015-07-11 ENCOUNTER — Telehealth: Payer: Self-pay | Admitting: *Deleted

## 2015-07-11 DIAGNOSIS — C189 Malignant neoplasm of colon, unspecified: Secondary | ICD-10-CM

## 2015-07-11 NOTE — Telephone Encounter (Signed)
They called patient about shipping her next course of Lonsurf and she told them she didn't think she needed it. They are inquiring if she was to only get 1 cycle, she has refills left on prescription. Please call

## 2015-07-11 NOTE — Telephone Encounter (Signed)
Patient to follow up in clinic next week, per Dr. Grayland Ormond will wait until after follow up appointment to decide on future cycles of Lonsurf. Left message for Mickel Baas at [facility].

## 2015-07-15 ENCOUNTER — Inpatient Hospital Stay: Payer: Medicare Other | Attending: Oncology

## 2015-07-15 ENCOUNTER — Inpatient Hospital Stay: Payer: Medicare Other | Admitting: Oncology

## 2015-07-15 ENCOUNTER — Telehealth: Payer: Self-pay | Admitting: *Deleted

## 2015-07-15 DIAGNOSIS — G8929 Other chronic pain: Secondary | ICD-10-CM | POA: Diagnosis not present

## 2015-07-15 DIAGNOSIS — C7800 Secondary malignant neoplasm of unspecified lung: Secondary | ICD-10-CM | POA: Diagnosis not present

## 2015-07-15 DIAGNOSIS — R5381 Other malaise: Secondary | ICD-10-CM | POA: Insufficient documentation

## 2015-07-15 DIAGNOSIS — R5383 Other fatigue: Secondary | ICD-10-CM | POA: Diagnosis not present

## 2015-07-15 DIAGNOSIS — R0602 Shortness of breath: Secondary | ICD-10-CM | POA: Diagnosis not present

## 2015-07-15 DIAGNOSIS — R109 Unspecified abdominal pain: Secondary | ICD-10-CM | POA: Diagnosis not present

## 2015-07-15 DIAGNOSIS — Z79899 Other long term (current) drug therapy: Secondary | ICD-10-CM | POA: Diagnosis not present

## 2015-07-15 DIAGNOSIS — R630 Anorexia: Secondary | ICD-10-CM | POA: Insufficient documentation

## 2015-07-15 DIAGNOSIS — R110 Nausea: Secondary | ICD-10-CM | POA: Insufficient documentation

## 2015-07-15 DIAGNOSIS — R531 Weakness: Secondary | ICD-10-CM | POA: Insufficient documentation

## 2015-07-15 DIAGNOSIS — C787 Secondary malignant neoplasm of liver and intrahepatic bile duct: Secondary | ICD-10-CM | POA: Insufficient documentation

## 2015-07-15 DIAGNOSIS — C189 Malignant neoplasm of colon, unspecified: Secondary | ICD-10-CM | POA: Insufficient documentation

## 2015-07-15 DIAGNOSIS — M549 Dorsalgia, unspecified: Secondary | ICD-10-CM | POA: Insufficient documentation

## 2015-07-15 DIAGNOSIS — I10 Essential (primary) hypertension: Secondary | ICD-10-CM | POA: Insufficient documentation

## 2015-07-15 LAB — COMPREHENSIVE METABOLIC PANEL
ALBUMIN: 2.1 g/dL — AB (ref 3.5–5.0)
ALT: 73 U/L — ABNORMAL HIGH (ref 14–54)
ANION GAP: 7 (ref 5–15)
AST: 114 U/L — ABNORMAL HIGH (ref 15–41)
Alkaline Phosphatase: 537 U/L — ABNORMAL HIGH (ref 38–126)
BILIRUBIN TOTAL: 27.3 mg/dL — AB (ref 0.3–1.2)
BUN: 19 mg/dL (ref 6–20)
CALCIUM: 8 mg/dL — AB (ref 8.9–10.3)
CO2: 21 mmol/L — ABNORMAL LOW (ref 22–32)
Chloride: 106 mmol/L (ref 101–111)
Creatinine, Ser: 1.32 mg/dL — ABNORMAL HIGH (ref 0.44–1.00)
GFR calc Af Amer: 45 mL/min — ABNORMAL LOW (ref >60)
GFR, EST NON AFRICAN AMERICAN: 39 mL/min — AB (ref >60)
GLUCOSE: 80 mg/dL (ref 65–99)
Potassium: 2.9 mmol/L — CL (ref 3.5–5.1)
Sodium: 134 mmol/L — ABNORMAL LOW (ref 135–145)
TOTAL PROTEIN: 5.9 g/dL — AB (ref 6.5–8.1)

## 2015-07-15 LAB — PHOSPHORUS: Phosphorus: 1.7 mg/dL — ABNORMAL LOW (ref 2.5–4.6)

## 2015-07-15 LAB — MAGNESIUM: Magnesium: 1.9 mg/dL (ref 1.7–2.4)

## 2015-07-15 NOTE — Telephone Encounter (Signed)
Please confirm that patient is taking K+ supplement.  She need referral to GI for ERCP and stenting.

## 2015-07-15 NOTE — Telephone Encounter (Signed)
Critical Lab - K+ 2.9 and Bilirubin 27.3  MD notified.

## 2015-07-16 ENCOUNTER — Other Ambulatory Visit: Payer: Self-pay | Admitting: *Deleted

## 2015-07-16 MED ORDER — POTASSIUM CHLORIDE CRYS ER 20 MEQ PO TBCR: 20.0000 meq | EXTENDED_RELEASE_TABLET | Freq: Two times a day (BID) | ORAL | Status: AC

## 2015-07-16 NOTE — Telephone Encounter (Signed)
Patient is established with Dr. Candace Cruise, referral sent. Per patient she is taking K+ 10 meq once daily, would you like to increase that dose?

## 2015-07-16 NOTE — Telephone Encounter (Signed)
Yes.  Increase to 26meq BID

## 2015-07-16 NOTE — Telephone Encounter (Signed)
Thank you, new prescription sent to pharmacy. I will let patient know.

## 2015-07-22 ENCOUNTER — Inpatient Hospital Stay (HOSPITAL_BASED_OUTPATIENT_CLINIC_OR_DEPARTMENT_OTHER): Payer: Medicare Other | Admitting: Oncology

## 2015-07-22 VITALS — BP 107/69 | HR 106 | Temp 101.0°F | Resp 20

## 2015-07-22 DIAGNOSIS — I10 Essential (primary) hypertension: Secondary | ICD-10-CM

## 2015-07-22 DIAGNOSIS — R5383 Other fatigue: Secondary | ICD-10-CM

## 2015-07-22 DIAGNOSIS — C189 Malignant neoplasm of colon, unspecified: Secondary | ICD-10-CM

## 2015-07-22 DIAGNOSIS — C787 Secondary malignant neoplasm of liver and intrahepatic bile duct: Secondary | ICD-10-CM | POA: Diagnosis not present

## 2015-07-22 DIAGNOSIS — R630 Anorexia: Secondary | ICD-10-CM

## 2015-07-22 DIAGNOSIS — R5381 Other malaise: Secondary | ICD-10-CM

## 2015-07-22 DIAGNOSIS — R109 Unspecified abdominal pain: Secondary | ICD-10-CM

## 2015-07-22 DIAGNOSIS — C7800 Secondary malignant neoplasm of unspecified lung: Secondary | ICD-10-CM | POA: Diagnosis not present

## 2015-07-22 DIAGNOSIS — R531 Weakness: Secondary | ICD-10-CM

## 2015-07-22 DIAGNOSIS — R0602 Shortness of breath: Secondary | ICD-10-CM

## 2015-07-22 DIAGNOSIS — R110 Nausea: Secondary | ICD-10-CM

## 2015-07-22 MED ORDER — MEGESTROL ACETATE 625 MG/5ML PO SUSP: 625.0000 mg | Freq: Every day | ORAL | Status: AC

## 2015-07-22 NOTE — Progress Notes (Signed)
Patient here today for follow up, had labs drawn last week. Family is concerned about patient being lethargic at home, also concerned with patients appetite and requesting stimulant for appetite today.

## 2015-07-23 ENCOUNTER — Ambulatory Visit: Payer: Medicare Other | Admitting: Oncology

## 2015-07-23 LAB — CBC WITH DIFFERENTIAL/PLATELET
Band Neutrophils: 6 % (ref 0–10)
Basophils Relative: 2 %
EOS PCT: 7 %
HEMATOCRIT: 30.6 % — AB (ref 35.0–47.0)
Hemoglobin: 9.8 g/dL — ABNORMAL LOW (ref 12.0–16.0)
Lymphocytes Relative: 5 %
MCH: 29.4 pg (ref 26.0–34.0)
MCHC: 32 g/dL (ref 32.0–36.0)
MCV: 91.8 fL (ref 80.0–100.0)
MONOS PCT: 5 %
NEUTROS PCT: 75 %
Platelets: 291 10*3/uL (ref 150–440)
RBC: 3.33 MIL/uL — ABNORMAL LOW (ref 3.80–5.20)
RDW: 26.9 % — AB (ref 11.5–14.5)
WBC: 7.1 10*3/uL (ref 3.6–11.0)
nRBC: 0 /100 WBC

## 2015-07-23 NOTE — Progress Notes (Signed)
[place]  Telephone:(336) [phone] Fax:(336) [phone]  ID: Christina Walker OB: [birth year]  MR#: [id number]  TLX#:[id number]  Patient Care Team: Tracie Harrier, MD as PCP - General (Internal Medicine)  CHIEF COMPLAINT:  Chief Complaint  Patient presents with  . Follow-up    colon cancer    INTERVAL HISTORY: Patient returns to clinic today for repeat laboratory work and further evaluation. Her performance status has significantly declined and patient is very lethargic. Entire history is given by her 2 daughters. She recently had ERCP with stent placement with mild improvement of her bilirubin. She continues to complain of abdominal pain. She also has a poor appetite and nausea. She continues to have chronic back pain. She has no other neurologic complaints. She denies any recent fevers. She has no chest pain or shortness of breath. She denies any vomiting, constipation, or diarrhea. She has no melena or hematochezia. She has no urinary complaints. Patient offers no further specific complaints today.  REVIEW OF SYSTEMS:   Review of Systems  Constitutional: Positive for malaise/fatigue.  Respiratory: Positive for shortness of breath.   Gastrointestinal: Positive for nausea and abdominal pain. Negative for vomiting and diarrhea.  Neurological: Positive for sensory change and weakness.    As per HPI. Otherwise, a complete review of systems is negatve.  PAST MEDICAL HISTORY: Past Medical History  Diagnosis Date  . Colon cancer   . Back pain   . Anemia   . Hypertension     PAST SURGICAL HISTORY: Past Surgical History  Procedure Laterality Date  . Cholecystectomy    . Abdominal hysterectomy      FAMILY HISTORY: Father with prostate cancer.     ADVANCED DIRECTIVES:    HEALTH MAINTENANCE: Social History  Substance Use Topics  . Smoking status: Never Smoker   . Smokeless tobacco: Not on file  . Alcohol Use: Not on file      Colonoscopy:  PAP:  Bone density:  Lipid panel:  Allergies  Allergen Reactions  . No Known Allergies     Current Outpatient Prescriptions  Medication Sig Dispense Refill  . acetaminophen (TYLENOL) 325 MG tablet Take 650 mg by mouth every 4 (four) hours as needed.    . cyanocobalamin 500 MCG tablet Take 500 mcg by mouth daily.    . diazepam (VALIUM) 5 MG tablet Take 5 mg by mouth daily as needed for anxiety (at bedtime).    . ondansetron (ZOFRAN-ODT) 4 MG disintegrating tablet Take 4 mg by mouth every 8 (eight) hours as needed for nausea or vomiting.    . potassium chloride SA (K-DUR,KLOR-CON) 20 MEQ tablet Take 1 tablet (20 mEq total) by mouth 2 (two) times daily. 60 tablet 2  . simvastatin (ZOCOR) 20 MG tablet Take 20 mg by mouth daily.    Marland Kitchen triamterene-hydrochlorothiazide (MAXZIDE-25) 37.5-25 MG per tablet Take 1 tablet by mouth daily.    . megestrol (MEGACE ES) 625 MG/5ML suspension Take 5 mLs (625 mg total) by mouth daily. 150 mL 0  . potassium chloride (K-DUR,KLOR-CON) 10 MEQ tablet Take 10 mEq by mouth daily.    Marland Kitchen trifluridine-tipiracil (LONSURF) 20-8.19 MG tablet Take 3 tablets (60 mg of trifluridine total) by mouth 2 (two) times daily after a meal. 1 hr after AM & PM meals on days 1-5, 8-12. Repeat every 28day (Patient not taking: Reported on 07/22/2015) 60 tablet 2   No current facility-administered medications for this visit.   Facility-Administered Medications Ordered in Other Visits  Medication Dose Route Frequency  Provider Last Rate Last Dose  . sodium chloride 0.9 % injection 10 mL  10 mL Intracatheter PRN Lloyd Huger, MD        OBJECTIVE: Filed Vitals:   07/22/15 1651  BP: 107/69  Pulse: 106  Temp: 101 F (38.3 C)  Resp: 20     There is no weight on file to calculate BMI.    ECOG FS:3 - Symptomatic, >50% confined to bed  General: Ill-appearing, no acute distress. Eyes: icteric sclera. Lungs: Clear to auscultation bilaterally. Heart: Regular rate  and rhythm. No rubs, murmurs, or gallops. Abdomen: Soft, mild tenderness, normoactive bowel sounds. Musculoskeletal: No edema, cyanosis, or clubbing. Neuro: Lethargic Skin: Jaundice   LAB RESULTS:  Lab Results  Component Value Date   NA 134* 07/15/2015   K 2.9* 07/15/2015   CL 106 07/15/2015   CO2 21* 07/15/2015   GLUCOSE 80 07/15/2015   BUN 19 07/15/2015   CREATININE 1.32* 07/15/2015   CALCIUM 8.0* 07/15/2015   PROT 5.9* 07/15/2015   ALBUMIN 2.1* 07/15/2015   AST 114* 07/15/2015   ALT 73* 07/15/2015   ALKPHOS 537* 07/15/2015   BILITOT 27.3* 07/15/2015   GFRNONAA 39* 07/15/2015   GFRAA 45* 07/15/2015    Lab Results  Component Value Date   WBC 7.1 07/15/2015   NEUTROABS 4.0 05/27/2015   HGB 9.8* 07/15/2015   HCT 30.6* 07/15/2015   MCV 91.8 07/15/2015   PLT 291 07/15/2015     STUDIES: No results found.  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  PLAN:    1. Colon cancer: CT scan results previously reviewed independently with progression of disease. Given patient's rapidly declining performance status, no further chemotherapy is planned and patient and family have agreed to enroll in hospice. No further follow-up is necessary.     2. Appetite: Continue Megace as needed. 3. Diarrhea: Patient does not complain of this today. Continue OTC Imodium as needed. 4. Pancytopenia: Resolved. 5. Hyperbilirubinemia: Improved since stent placement approximately 3 days ago. Patient enroll in hospice, no further intervention her labs needed.  Approximately 30 minutes was spent on discussion and consultation.  Patient expressed understanding and was in agreement with this plan. She also understands that She can call clinic at any time with any questions, concerns, or complaints.    Lloyd Huger, MD   07/23/2015 10:41 AM

## 2015-07-24 ENCOUNTER — Telehealth: Payer: Self-pay | Admitting: *Deleted

## 2015-07-24 NOTE — Telephone Encounter (Signed)
Called to report patietn was found in floor by her sister in law today and her temp was 104, SHe has been transferred to the hospice home for end of life care

## 2015-08-01 ENCOUNTER — Telehealth: Payer: Self-pay | Admitting: *Deleted

## 2015-08-30 NOTE — Telephone Encounter (Signed)
Hospice called to report Christina Walker expired on 08-31-15 at 1204 AM.  Will notify provider and managed Care of this event.

## 2015-08-30 NOTE — Telephone Encounter (Signed)
I have informed Ulice Dash and also requested any future appts be canceled.

## 2015-08-30 NOTE — Telephone Encounter (Signed)
Please cancel any future appts

## 2015-08-30 DEATH — deceased

## 2016-05-19 ENCOUNTER — Other Ambulatory Visit: Payer: Self-pay | Admitting: Nurse Practitioner

## 2017-04-01 IMAGING — US US ABDOMEN COMPLETE
1 series · 13 of 25 positions shown · non-contrast
Comparison: Abdominal and pelvic CT scan dated November 12, 2013

CLINICAL DATA: Generalized abdominal pain for the past 3 weeks,
elevated liver enzymes, previous cholecystectomy

EXAM:
ULTRASOUND ABDOMEN COMPLETE

[Series 1: us abdomen complete · 0.25mm/px · 13 of 92 slices shown]
[im 1/92]
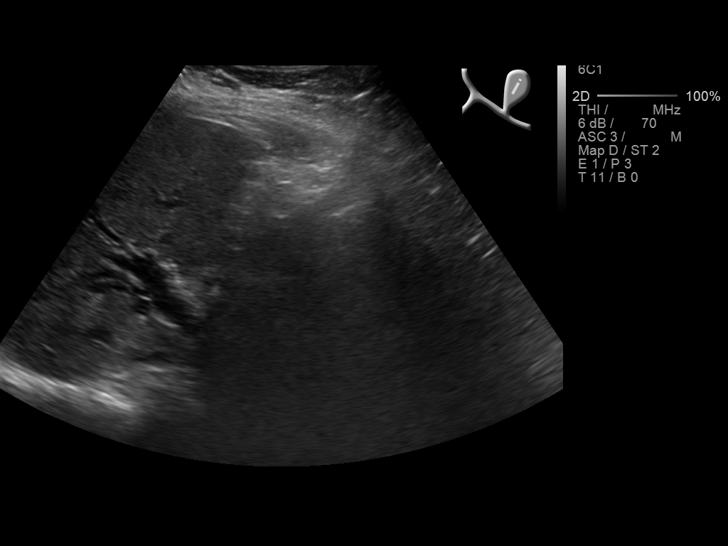
[im 8/92]
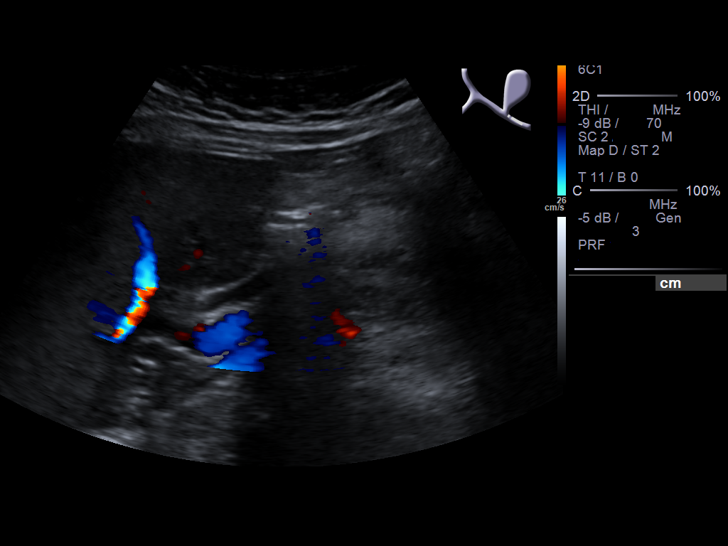
[im 16/92]
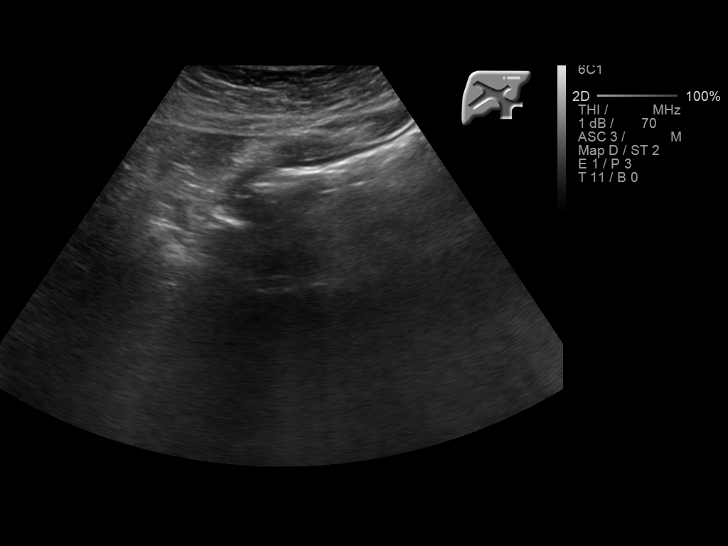
[im 23/92]
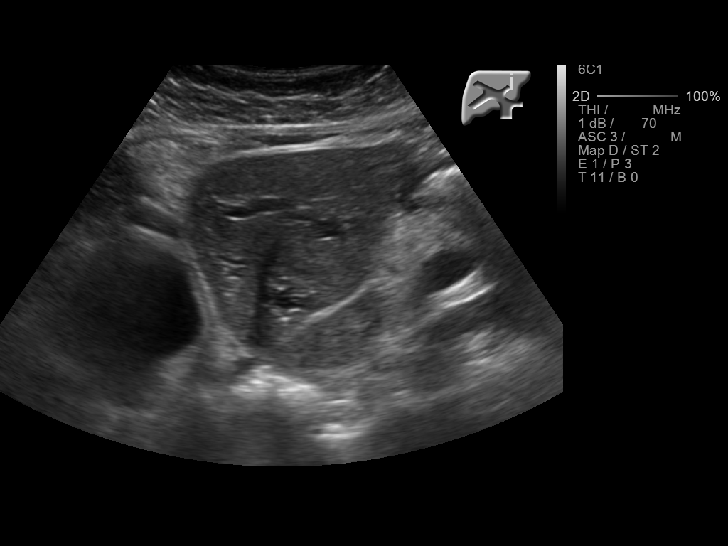
[im 31/92]
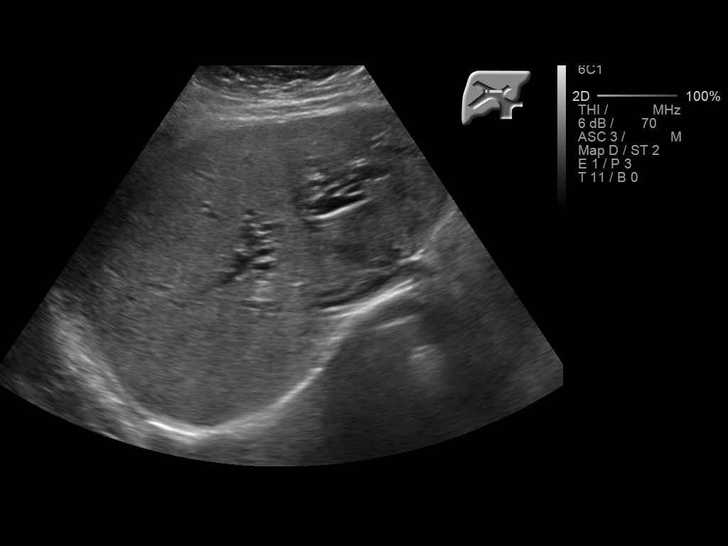
[im 38/92]
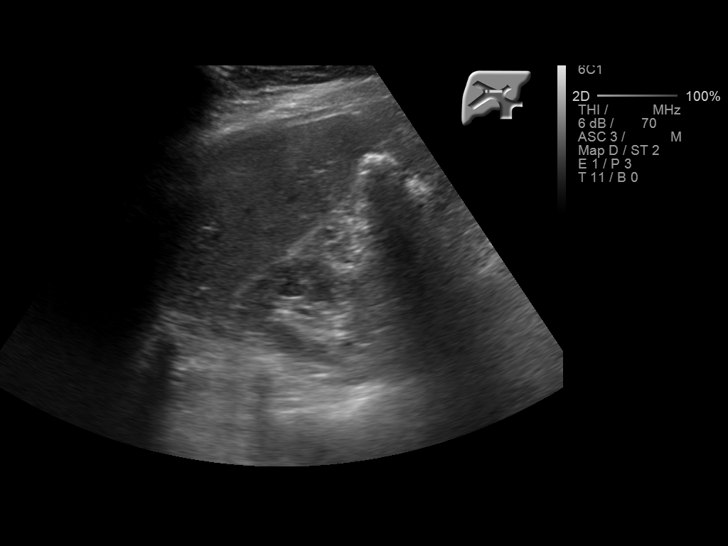
[im 46/92]
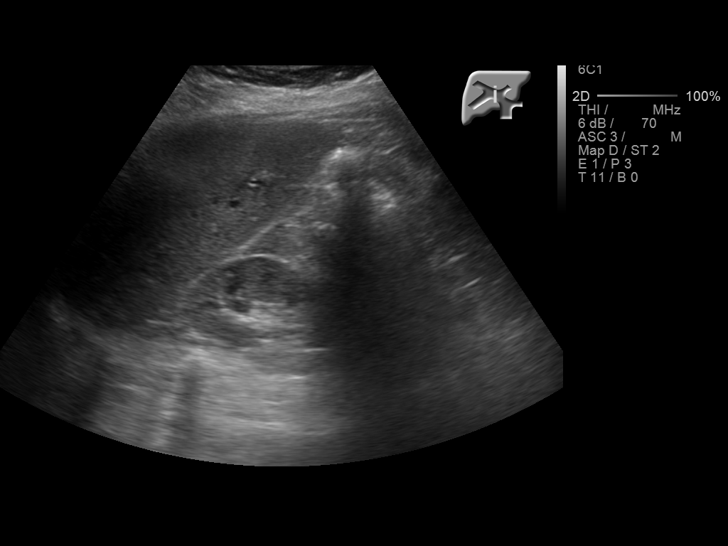
[im 54/92]
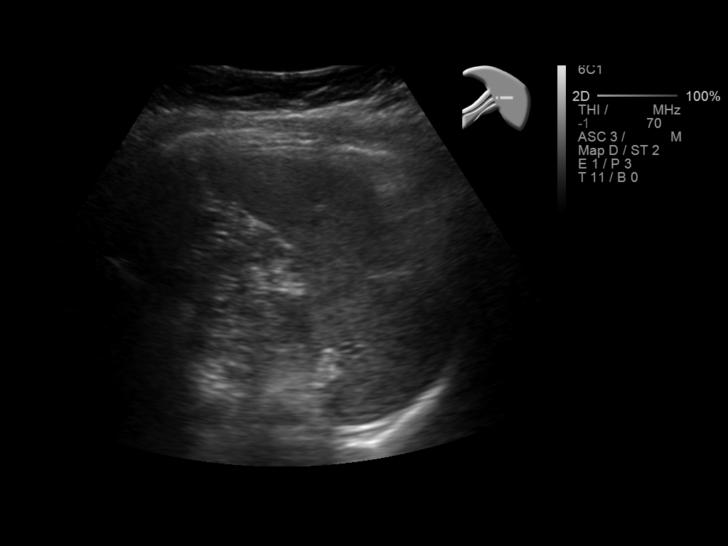
[im 61/92]
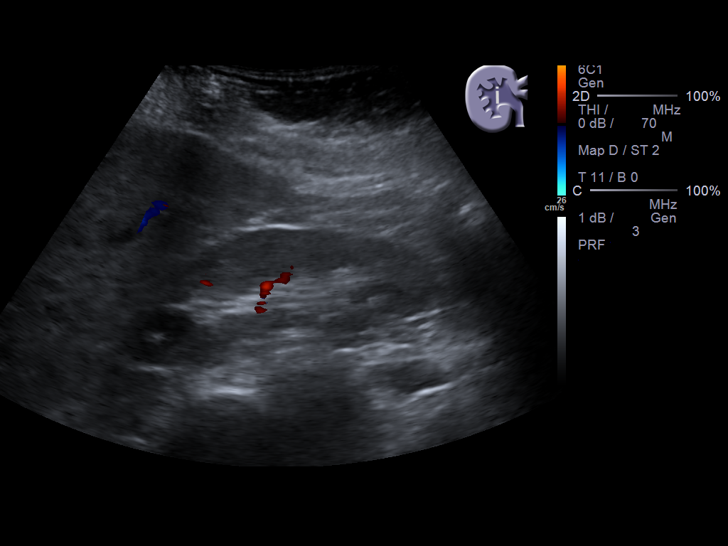
[im 69/92]
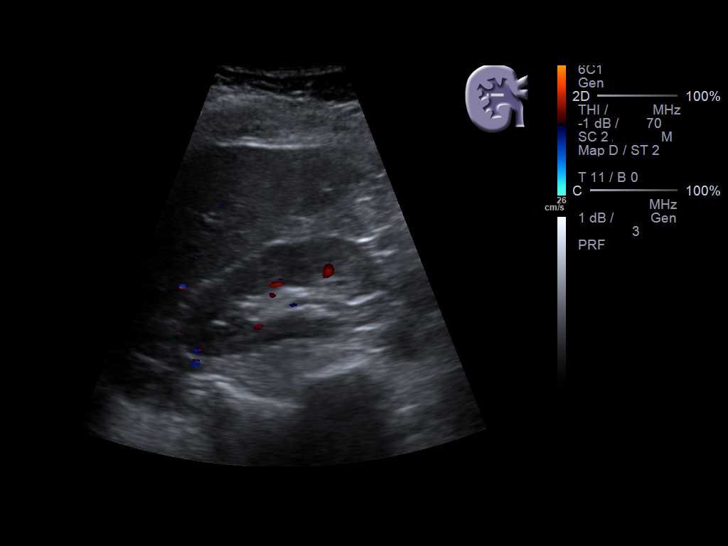
[im 76/92]
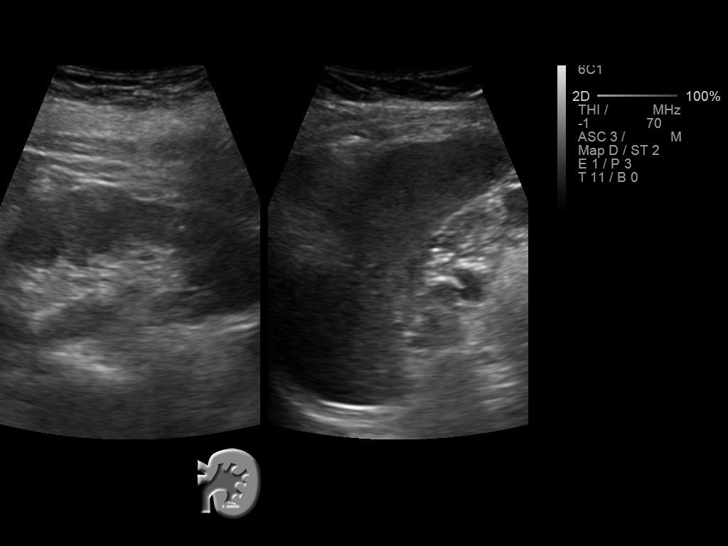
[im 84/92]
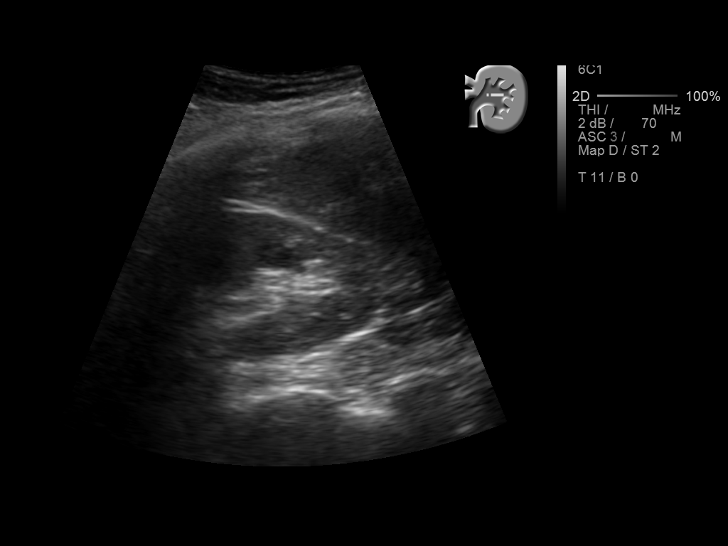
[im 92/92]
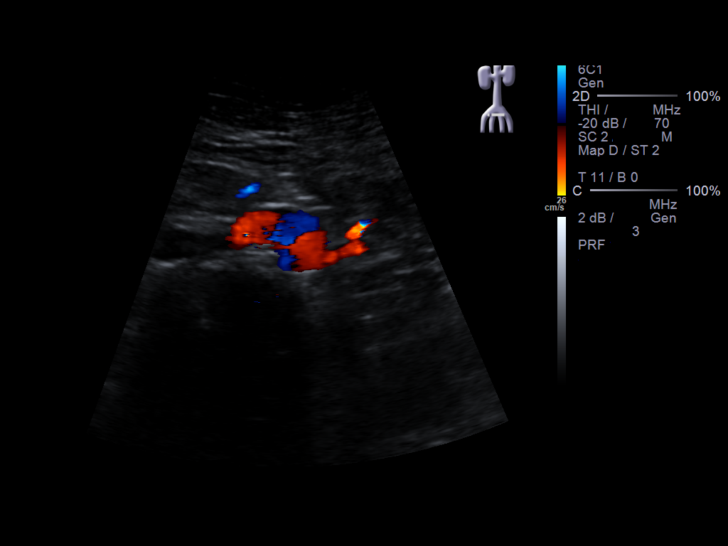

[13 of 25 positions shown; findings below may reference images not displayed]

FINDINGS: Gallbladder: The gallbladder is surgically absent.

Common bile duct: Diameter: 5 mm

Liver: The hepatic echotexture is normal. There is intrahepatic
ductal dilation. In 5869 there were hypodense hepatic foci
demonstrated which are not evident today.

IVC: No abnormality visualized.

Pancreas: Bowel gas obscures portions of the pancreatic head and
tail.

Spleen: Size and appearance within normal limits.

Right Kidney: Length: 10 cm. There is a midpole cyst measuring 7 mm
in greatest dimension. There is no hydronephrosis.

Left Kidney: Length: 8.9 cm. Echogenicity within normal limits. No
mass or hydronephrosis visualized.

Abdominal aorta: No aneurysm visualized.

Other findings: No ascites is observed.
IMPRESSION: 1. Intrahepatic ductal dilation of uncertain etiology. The common
bile duct is not significantly distended. No focal hepatic masses
observed on today's study. Evaluation of the pancreas is limited.
Further evaluation with abdominal MRI or CT scan is recommended.
2. There is no acute abnormality demonstrated elsewhere.

## 2021-01-27 DEATH — deceased
